# Patient Record
Sex: Female | Born: 1965
Health system: Southern US, Community
[De-identification: ages and names within clinical notes are randomized; demographics above are authoritative.]

## PROBLEM LIST (undated history)

## (undated) DIAGNOSIS — F410 Panic disorder [episodic paroxysmal anxiety] without agoraphobia: Secondary | ICD-10-CM

## (undated) DIAGNOSIS — R7303 Prediabetes: Secondary | ICD-10-CM

## (undated) DIAGNOSIS — Z87442 Personal history of urinary calculi: Secondary | ICD-10-CM

## (undated) DIAGNOSIS — J302 Other seasonal allergic rhinitis: Secondary | ICD-10-CM

## (undated) DIAGNOSIS — F419 Anxiety disorder, unspecified: Secondary | ICD-10-CM

## (undated) DIAGNOSIS — Z8632 Personal history of gestational diabetes: Secondary | ICD-10-CM

## (undated) DIAGNOSIS — R002 Palpitations: Secondary | ICD-10-CM

## (undated) HISTORY — DX: Prediabetes: R73.03

## (undated) HISTORY — DX: Personal history of gestational diabetes: Z86.32

## (undated) HISTORY — PX: LITHOTRIPSY: SUR834

---

## 1990-07-28 HISTORY — PX: TUBAL LIGATION: SHX77

## 1997-12-26 ENCOUNTER — Other Ambulatory Visit: Admission: RE | Admit: 1997-12-26 | Discharge: 1997-12-26 | Payer: Self-pay | Admitting: Obstetrics and Gynecology

## 1998-06-26 ENCOUNTER — Other Ambulatory Visit: Admission: RE | Admit: 1998-06-26 | Discharge: 1998-06-26 | Payer: Self-pay | Admitting: Obstetrics and Gynecology

## 1998-07-17 ENCOUNTER — Other Ambulatory Visit: Admission: RE | Admit: 1998-07-17 | Discharge: 1998-07-17 | Payer: Self-pay | Admitting: Obstetrics and Gynecology

## 1998-09-26 ENCOUNTER — Emergency Department (HOSPITAL_COMMUNITY): Admission: EM | Admit: 1998-09-26 | Discharge: 1998-09-26 | Payer: Self-pay | Admitting: Emergency Medicine

## 1999-02-04 ENCOUNTER — Emergency Department (HOSPITAL_COMMUNITY): Admission: EM | Admit: 1999-02-04 | Discharge: 1999-02-04 | Payer: Self-pay | Admitting: Emergency Medicine

## 1999-08-26 ENCOUNTER — Other Ambulatory Visit: Admission: RE | Admit: 1999-08-26 | Discharge: 1999-08-26 | Payer: Self-pay | Admitting: Family Medicine

## 2000-07-04 ENCOUNTER — Emergency Department (HOSPITAL_COMMUNITY): Admission: EM | Admit: 2000-07-04 | Discharge: 2000-07-05 | Payer: Self-pay | Admitting: Emergency Medicine

## 2000-07-05 ENCOUNTER — Encounter: Payer: Self-pay | Admitting: Emergency Medicine

## 2000-07-05 ENCOUNTER — Encounter: Payer: Self-pay | Admitting: Urology

## 2000-07-05 ENCOUNTER — Ambulatory Visit (HOSPITAL_COMMUNITY): Admission: EM | Admit: 2000-07-05 | Discharge: 2000-07-05 | Payer: Self-pay | Admitting: Emergency Medicine

## 2000-07-09 ENCOUNTER — Encounter: Payer: Self-pay | Admitting: Urology

## 2000-07-09 ENCOUNTER — Ambulatory Visit (HOSPITAL_COMMUNITY): Admission: RE | Admit: 2000-07-09 | Discharge: 2000-07-09 | Payer: Self-pay | Admitting: Urology

## 2000-08-06 ENCOUNTER — Ambulatory Visit (HOSPITAL_COMMUNITY): Admission: RE | Admit: 2000-08-06 | Discharge: 2000-08-06 | Payer: Self-pay | Admitting: Urology

## 2000-08-06 ENCOUNTER — Encounter: Payer: Self-pay | Admitting: Urology

## 2002-11-15 ENCOUNTER — Encounter: Admission: RE | Admit: 2002-11-15 | Discharge: 2002-11-15 | Payer: Self-pay | Admitting: Family Medicine

## 2002-11-15 ENCOUNTER — Encounter: Payer: Self-pay | Admitting: Family Medicine

## 2003-09-08 ENCOUNTER — Other Ambulatory Visit: Admission: RE | Admit: 2003-09-08 | Discharge: 2003-09-08 | Payer: Self-pay | Admitting: Family Medicine

## 2003-11-23 ENCOUNTER — Other Ambulatory Visit: Admission: RE | Admit: 2003-11-23 | Discharge: 2003-11-23 | Payer: Self-pay | Admitting: Obstetrics and Gynecology

## 2004-06-19 ENCOUNTER — Other Ambulatory Visit: Admission: RE | Admit: 2004-06-19 | Discharge: 2004-06-19 | Payer: Self-pay | Admitting: Obstetrics and Gynecology

## 2004-11-25 HISTORY — PX: OTHER SURGICAL HISTORY: SHX169

## 2005-01-08 ENCOUNTER — Ambulatory Visit (HOSPITAL_COMMUNITY): Admission: RE | Admit: 2005-01-08 | Discharge: 2005-01-08 | Payer: Self-pay | Admitting: Obstetrics and Gynecology

## 2005-05-01 ENCOUNTER — Inpatient Hospital Stay (HOSPITAL_COMMUNITY): Admission: AD | Admit: 2005-05-01 | Discharge: 2005-05-02 | Payer: Self-pay | Admitting: Obstetrics and Gynecology

## 2005-08-02 ENCOUNTER — Inpatient Hospital Stay (HOSPITAL_COMMUNITY): Admission: AD | Admit: 2005-08-02 | Discharge: 2005-08-02 | Payer: Self-pay | Admitting: Obstetrics and Gynecology

## 2005-08-21 ENCOUNTER — Inpatient Hospital Stay (HOSPITAL_COMMUNITY): Admission: AD | Admit: 2005-08-21 | Discharge: 2005-08-24 | Payer: Self-pay | Admitting: Obstetrics and Gynecology

## 2007-05-01 ENCOUNTER — Inpatient Hospital Stay (HOSPITAL_COMMUNITY): Admission: AD | Admit: 2007-05-01 | Discharge: 2007-05-01 | Payer: Self-pay | Admitting: Obstetrics and Gynecology

## 2008-09-11 ENCOUNTER — Other Ambulatory Visit: Admission: RE | Admit: 2008-09-11 | Discharge: 2008-09-11 | Payer: Self-pay | Admitting: Obstetrics and Gynecology

## 2008-09-14 ENCOUNTER — Encounter (INDEPENDENT_AMBULATORY_CARE_PROVIDER_SITE_OTHER): Payer: Self-pay | Admitting: Obstetrics and Gynecology

## 2008-09-14 ENCOUNTER — Ambulatory Visit (HOSPITAL_COMMUNITY): Admission: RE | Admit: 2008-09-14 | Discharge: 2008-09-14 | Payer: Self-pay | Admitting: Obstetrics and Gynecology

## 2009-06-13 ENCOUNTER — Other Ambulatory Visit: Admission: RE | Admit: 2009-06-13 | Discharge: 2009-06-13 | Payer: Self-pay | Admitting: Obstetrics and Gynecology

## 2009-06-25 LAB — CONVERTED CEMR LAB: Pap Smear: ABNORMAL

## 2009-07-06 ENCOUNTER — Ambulatory Visit: Payer: Self-pay | Admitting: Family

## 2009-07-06 ENCOUNTER — Encounter (INDEPENDENT_AMBULATORY_CARE_PROVIDER_SITE_OTHER): Payer: Self-pay | Admitting: *Deleted

## 2009-07-06 DIAGNOSIS — S61209A Unspecified open wound of unspecified finger without damage to nail, initial encounter: Secondary | ICD-10-CM | POA: Insufficient documentation

## 2009-08-25 ENCOUNTER — Inpatient Hospital Stay (HOSPITAL_COMMUNITY): Admission: AD | Admit: 2009-08-25 | Discharge: 2009-08-25 | Payer: Self-pay | Admitting: Obstetrics and Gynecology

## 2009-10-15 ENCOUNTER — Inpatient Hospital Stay (HOSPITAL_COMMUNITY): Admission: AD | Admit: 2009-10-15 | Discharge: 2009-10-15 | Payer: Self-pay | Admitting: Obstetrics and Gynecology

## 2009-10-15 ENCOUNTER — Ambulatory Visit: Payer: Self-pay | Admitting: Physician Assistant

## 2010-01-01 ENCOUNTER — Inpatient Hospital Stay (HOSPITAL_COMMUNITY): Admission: RE | Admit: 2010-01-01 | Discharge: 2010-01-03 | Payer: Self-pay | Admitting: Obstetrics and Gynecology

## 2010-01-03 ENCOUNTER — Encounter: Admission: RE | Admit: 2010-01-03 | Discharge: 2010-01-24 | Payer: Self-pay | Admitting: Obstetrics and Gynecology

## 2010-01-08 ENCOUNTER — Ambulatory Visit: Payer: Self-pay | Admitting: Nurse Practitioner

## 2010-01-08 ENCOUNTER — Inpatient Hospital Stay (HOSPITAL_COMMUNITY): Admission: AD | Admit: 2010-01-08 | Discharge: 2010-01-08 | Payer: Self-pay | Admitting: Obstetrics and Gynecology

## 2010-01-13 ENCOUNTER — Inpatient Hospital Stay (HOSPITAL_COMMUNITY): Admission: AD | Admit: 2010-01-13 | Discharge: 2010-01-13 | Payer: Self-pay | Admitting: Obstetrics and Gynecology

## 2010-01-13 ENCOUNTER — Ambulatory Visit: Payer: Self-pay | Admitting: Advanced Practice Midwife

## 2010-08-17 ENCOUNTER — Encounter: Payer: Self-pay | Admitting: Obstetrics and Gynecology

## 2010-08-18 ENCOUNTER — Encounter: Payer: Self-pay | Admitting: Obstetrics and Gynecology

## 2010-10-13 LAB — WET PREP, GENITAL
Clue Cells Wet Prep HPF POC: NONE SEEN
Trich, Wet Prep: NONE SEEN
Yeast Wet Prep HPF POC: NONE SEEN

## 2010-10-13 LAB — URINALYSIS, ROUTINE W REFLEX MICROSCOPIC
Ketones, ur: NEGATIVE mg/dL
Protein, ur: NEGATIVE mg/dL
Urobilinogen, UA: 0.2 mg/dL (ref 0.0–1.0)
pH: 5 (ref 5.0–8.0)

## 2010-10-13 LAB — COMPREHENSIVE METABOLIC PANEL
AST: 22 U/L (ref 0–37)
CO2: 24 mEq/L (ref 19–32)
GFR calc Af Amer: >60 mL/min (ref >60)
Glucose, Bld: 122 mg/dL — ABNORMAL HIGH (ref 70–99)
Potassium: 3.2 mEq/L — ABNORMAL LOW (ref 3.5–5.1)

## 2010-10-13 LAB — DIFFERENTIAL
Basophils Absolute: 0 10*3/uL (ref 0.0–0.1)
Eosinophils Relative: 0 % (ref 0–5)
Monocytes Absolute: 0.2 10*3/uL (ref 0.1–1.0)
Monocytes Relative: 2 % — ABNORMAL LOW (ref 3–12)
Neutro Abs: 8.7 10*3/uL — ABNORMAL HIGH (ref 1.7–7.7)

## 2010-10-13 LAB — CBC
MCV: 88.8 fL (ref 78.0–100.0)
WBC: 9.2 10*3/uL (ref 4.0–10.5)

## 2010-10-13 LAB — URINE MICROSCOPIC-ADD ON

## 2010-10-14 LAB — URINALYSIS, ROUTINE W REFLEX MICROSCOPIC
Nitrite: NEGATIVE
Protein, ur: NEGATIVE mg/dL
Specific Gravity, Urine: 1.01 (ref 1.005–1.030)

## 2010-10-14 LAB — CBC
HCT: 32.3 % — ABNORMAL LOW (ref 36.0–46.0)
Platelets: 130 10*3/uL — ABNORMAL LOW (ref 150–400)
Platelets: 165 10*3/uL (ref 150–400)
RBC: 3.7 MIL/uL — ABNORMAL LOW (ref 3.87–5.11)
RBC: 4.11 MIL/uL (ref 3.87–5.11)
WBC: 7.7 10*3/uL (ref 4.0–10.5)

## 2010-10-14 LAB — COMPREHENSIVE METABOLIC PANEL
AST: 27 U/L (ref 0–37)
Albumin: 2.9 g/dL — ABNORMAL LOW (ref 3.5–5.2)
BUN: 19 mg/dL (ref 6–23)
Calcium: 8.8 mg/dL (ref 8.4–10.5)
Creatinine, Ser: 0.59 mg/dL (ref 0.4–1.2)
GFR calc Af Amer: >60 mL/min (ref >60)
Glucose, Bld: 86 mg/dL (ref 70–99)
Potassium: 3.5 mEq/L (ref 3.5–5.1)
Total Protein: 5.9 g/dL — ABNORMAL LOW (ref 6.0–8.3)

## 2010-10-14 LAB — URINE MICROSCOPIC-ADD ON

## 2010-10-14 LAB — GLUCOSE, CAPILLARY
Glucose-Capillary: 88 mg/dL (ref 70–99)
Glucose-Capillary: 91 mg/dL (ref 70–99)
Glucose-Capillary: 92 mg/dL (ref 70–99)
Glucose-Capillary: 95 mg/dL (ref 70–99)

## 2010-10-14 LAB — URIC ACID: Uric Acid, Serum: 4.1 mg/dL (ref 2.4–7.0)

## 2010-10-14 LAB — LACTATE DEHYDROGENASE: LDH: 287 U/L — ABNORMAL HIGH (ref 94–250)

## 2010-10-14 LAB — RPR: RPR Ser Ql: NONREACTIVE

## 2010-10-20 LAB — URINALYSIS, ROUTINE W REFLEX MICROSCOPIC
Bilirubin Urine: NEGATIVE
Glucose, UA: NEGATIVE mg/dL
Hgb urine dipstick: NEGATIVE
Ketones, ur: NEGATIVE mg/dL
Protein, ur: NEGATIVE mg/dL
Specific Gravity, Urine: 1.02 (ref 1.005–1.030)
pH: 5.5 (ref 5.0–8.0)

## 2010-11-12 LAB — ABO/RH: ABO/RH(D): A POS

## 2010-11-12 LAB — CBC
MCHC: 33 g/dL (ref 30.0–36.0)
MCV: 87.4 fL (ref 78.0–100.0)
Platelets: 193 10*3/uL (ref 150–400)
RBC: 4.6 MIL/uL (ref 3.87–5.11)
RDW: 14 % (ref 11.5–15.5)

## 2010-12-10 NOTE — H&P (Signed)
[name], [name]              ACCOUNT NO.:  [id number]   MEDICAL RECORD NO.:  [id number]          PATIENT TYPE:  AMB   LOCATION:  SDC                           [facility]ILITY:  [facility]   PHYSICIAN:  Charles A. Delcambre, MDDATE OF BIRTH:  1966/06/13   DATE OF ADMISSION:  DATE OF DISCHARGE:                              HISTORY & PHYSICAL   A 45 year old gravida 7, para 4-0-2-4 noted today after no fetal heart  tones were noted at 10 weeks and 4 days, to be consistent with a fetal  demise at 8 weeks and 6 days.  This would be after having 2 prior  ultrasounds during this pregnancy.  They showed a heartbeat.  She was  given options of expectant management versus Cytotec induction versus D  and C.  She would like to get chromosome analysis of the specimen.  Products of conception done as well.   PAST MEDICAL HISTORY:  Recurrent abortions and seasonal allergies.   SURGICAL HISTORY:  D and C x2, STD x4, wide local excision of the vulva  questionable in past, renal stones.   MEDICATIONS:  Prenatal vitamins.   ALLERGIES:  No known drug allergies.   SOCIAL HISTORY:  No tobacco, ethanol, or drug use.  The patient has  monogamous relationship with her husband.   FAMILY HISTORY:  Denies family history of breast, uterus, ovaries,  cervix, colon cancer, coronary artery disease, stroke, diabetes,  hypertension, and lymphoma.   REVIEW OF SYSTEMS:  No fever, chills, rashes, lesions, headaches,  dizziness, or seasonal allergies.  No chest pain.  No shortness of  breath or wheezing.  No diarrhea, constipation, bleeding, urgency,  frequency, dysuria, hematuria, galactorrhea, or emotional changes.   PHYSICAL EXAMINATION:  LUNGS:  Clear bilaterally.  HEART:  Regular rate and rhythm.  ABDOMEN:  Soft, flat, and nontender.  PELVIC:  Normal external female genitalia.  Bartholin, urethral, and  Skene are normal.  Vault without discharge or lesions.  Multiparous  cervix, uterus consistent with 1-week size  uterus.  Adnexa, nontender  without masses bilaterally.  Ovaries are palpable with a normal size  bilaterally.   ASSESSMENT:  A 8 weeks and 5-day fetal demise and 10-week 4 days  pregnancy.  Blood type is A positive.  She gets a plan of suction D and  C.  She is informed consent and accepts the risk of infection, bleeding,  bowel and  bladder damage, retained products of chronic second D and C .  All  questions are answered.  Uterine perforation risk has addressed as well.  She will remain n.p.o. past midnight and have 700 and present for  suction dilation and curettage 02:18 p.m. and 3:30 p.m.      Charles A. Sydnee Cabal, MD  Electronically Signed     CAD/MEDQ  D:  09/12/2008  T:  09/13/2008  Job:  [id number]

## 2010-12-10 NOTE — Op Note (Signed)
[name], [name]              ACCOUNT NO.:  [id number]   MEDICAL RECORD NO.:  [id number]          PATIENT TYPE:  AMB   LOCATION:  SDC                           [facility]  [facility]   PHYSICIAN:  Charles A. Delcambre, MDDATE OF BIRTH:  10/03/1965   DATE OF PROCEDURE:  09/14/2008  DATE OF DISCHARGE:                               OPERATIVE REPORT   PREOPERATIVE DIAGNOSIS:  10-week pregnancy with 8-week 4-day fetal  demise.   POSTOPERATIVE DIAGNOSIS:  10-week pregnancy with 8-week 4-day fetal  demise.   PROCEDURE:  1. Dilation and evacuation.  2. Paracervical block.   SURGEON:  Charles A. Delcambre, MD   ASSISTANT:  None.   COMPLICATIONS:  None.   ESTIMATED BLOOD LOSS:  100 mL with D&E plus 200 mL in the vault clots.   SPECIMEN:  Large amount of products of conception to  pathology/cytogenetics for karyotype and per patient's request.   Instrument, sponge, and needle count correct x2.   DESCRIPTION OF PROCEDURE:  The patient was taken to the operating room,  placed in supine position.  General anesthetic and monitored anesthesia  care was undertaken.  She was in dorsal lithotomy position.  Sterile  prep and drape was undertaken.  Clots were removed from the vagina.  No  products of conception were noted.  Cervix was opened over 1 cm with no  active bleeding noted for the moment.  A 9-mm suction curette at 50 cmHg  was placed to the fundus, rotated, and withdrawn at the same time  yielding large amount of tissue.  I used a 9-mm suction curette.  After  four passes, minimal tissue was obtained.  Banjo curette was then used  to circumferentially curette the internal surface of the uterus.  This  did drag out some more tissue.  Suction curette was placed and yielded  no further tissue x2.  Procedure was then terminated, tack was removed.  Paracervical block had been placed at 4 and 8 o'clock, 0.25% plain  Marcaine, 10 mL at each spot.  There was no evidence of fascial location  of injection.  The patient was awakened, taken to recovery with  physician in attendance having tolerated the procedure well.     Charles A. Sydnee Cabal, MD  Electronically Signed    CAD/MEDQ  D:  09/14/2008  T:  09/15/2008  Job:  [id number]

## 2010-12-13 NOTE — H&P (Signed)
[name], HENDEL              ACCOUNT NO.:  [id number]   MEDICAL RECORD NO.:  [id number]          PATIENT TYPE:  INP   LOCATION:  9164                          [facility]ILITY[facility]  [facility]   PHYSICIAN:  Charles A. Delcambre, MDDATE OF BIRTH:  July 14, 1966   DATE OF ADMISSION:  08/21/2005  DATE OF DISCHARGE:                                HISTORY & PHYSICAL   HISTORY OF PRESENT ILLNESS:  This is a 45 year old para 3, 0, 0, 3 at 40  weeks and 1 day, [facility] Aug 20, 2005. Cervix today in the office was 1.5 cm,  50%, -3 station with baby at a very high station. Questionable vertex as I  could not feel the fontanel. Fundal height 38 cm and with my gut feeling, I  did ultrasound her in the office. Judgment was that of amniotic fluid was  low in the ballpark of 5 cm or less. Approximately 4 cm based on my gross  judgment. I did not have time to measure AFI as I was behind in the office,  so I sent her for an official ultrasound at the hospital where AFI did  return 4.1 cm. She is now admitted for induction. Covering physician, Dr.  Mia Creek will monitor Britnay over night and my partner from [facility],  Dr. Richardson Dopp will proceed with induction in the morning if she remains stable  throughout the night. These physicians will cover me in my absence.   Pregnancy has been complicated by advanced maternal age, second trimester  bleeding, abnormal quad screen of 1:19 of Downs risk but this was  inadvertently done as a quad screen when it was meant to be just an AFP at  16 weeks, as she did have a first trimester screen that was normal.  Estimated gestational age is based on 8-week ultrasound giving her an [facility] of  August 20, 2005, LMP of November 07, 2004 had been irregular from her regular  periods which would give an [facility] of Aug 14, 2005 but with the 8-week  ultrasound difference by 1 week, and the periods being irregular, we did  judge to go on with the ultrasound due date of August 20, 2005. At most,  judgment  would be that she would be 41 weeks if regular period due date was  correct.   PREGNANCY LABS:  A positive, antibody screen negative, VDRL nonreactive,  rubella immune, hepatitis B surface antigen negative, HIV nonreactive. TSH  normal. Pap low-grade in November of 2005. Gonorrhea and Chlamydia negative.  Cystic fibrosis negative. Hemoglobin at 28 weeks 11.6. She did have a 1 hour  Glucola of 216 but 3 hour glucose tolerance test was normal. Group B strep  was positive at 36 weeks.   PAST MEDICAL HISTORY:  1.  Kidney stones removed by surgery.  2.  Vulvar dysplasia.  3.  History of tubal ligation with tubal reversal.  4.  Preterm trimester bleeding.   MEDICATIONS:  Prenatal vitamins with iron.   ALLERGIES:  No known drug allergies.   PAST SURGICAL HISTORY:  Tubal ligation, tubal reversal, removal of kidney  stones.   SOCIAL HISTORY:  No tobacco, ethanol or drug use. The patient is married and  lives with her husband.   FAMILY HISTORY:  She is adopted.   PHYSICAL EXAMINATION:  GENERAL:  Alert and oriented x3, in no distress.  VITAL SIGNS:  Weight 178 pounds. Height 5 feet, 8 inches. Normal blood  pressure per R. N. and at office today. Respirations 16, pulse 88.  HEENT EXAM:  Grossly within normal limits.  NECK:  Supple without thyromegaly or adenopathy.  BREASTS:  No mass, tenderness or discharge, skin or nipple changes  bilaterally.  HEART:  Regular rate and rhythm, 2/6 systolic ejection murmur at left  sternal border.  ABDOMEN:  Gravid, fundal height 38 cm.  PELVIC:  Cervix 1.5 cm, 50% effaced, -3 station as noted above under HPI.  Intact vertex on ultrasound. Estimated fetal weight consistent with  ultrasound, approximately 3-4 weeks ago at the [facility] to [facility] as I  recall. Approximately 3600 to 3800 kg.  On examination today in the office there was no evidence of ruptured  membranes, with a dry appearing vagina although I did not carry out a  sterile speculum  exam, having not suspected oligohydramnios and at that  point when I did check her cervix under routine examination in term.  EXTREMITIES:  Minimal edema bilaterally, nontender, symmetrical.   ASSESSMENT:  Intrauterine pregnancy at 40 weeks and 1 day estimated  gestational age based on 8-week ultrasound. Oligohydramnios at 4.1 AFI.  Positive for group B strep. Advanced maternal age. Second trimester  bleeding. High 1 hour Glucola at 216 with normal 3 hour glucose challenge  test.   PLAN:  Monitor overnight. If the fetal heart rate remains reassuring without  significant decelerations, will proceed with Cervidil placement in the  morning per Dr. Dawayne Patricia request and plan. Will artificial rupture of  membranes once the cervix is ripened and proceed with Pitocin induction.  Anticipate vaginal delivery. The patient is aware of plan and wishes to  proceed as well. We will use Penicillin prophylaxis starting with placement  of the Cervidil. Will proceed as outlined. Will turn care over at this time  to Dr. Mia Creek.      Charles A. Sydnee Cabal, MD  Electronically Signed     CAD/MEDQ  D:  08/21/2005  T:  08/21/2005  Job:  [id number]   cc:   Ginger Carne, MD  Fax: [phone]

## 2010-12-13 NOTE — H&P (Signed)
[name], [name]              ACCOUNT NO.:  [id number]   MEDICAL RECORD NO.:  [id number]          PATIENT TYPE:  OBV   LOCATION:  9196                          [facility]  [facility]   PHYSICIAN:  Charles A. Delcambre, MDDATE OF BIRTH:  1966/05/23   DATE OF ADMISSION:  05/01/2005  DATE OF DISCHARGE:                                HISTORY & PHYSICAL   HISTORY OF PRESENT ILLNESS:  This is a 45 year old gravida 4, para 3-0-0-3,  at 24 weeks 2 days estimated gestational age, who is now to be admitted for  observation overnight at least to follow for vaginal bleeding in the early  third trimester.  Developed overnight and she had bleeding like a period  during an hour and thereafter had some dark brown bleeding.  Several days  ago she had some bleeding after intercourse that was mixed spotting.  Otherwise, she notes active fetal movement.  She is Rh positive.  Other  prenatal labs are within normal limits except for Pap which is low grade.  She had an AFP which returned down syndrome 1 in 19, declined amniocentesis.  This had been done after a normal first trimester screen was done.  Instead  of what was ordered, just the AFP alone.  18-week level II ultrasound  revealed normal findings.   PAST MEDICAL HISTORY:  Kidney stones, vulvar dysplasia, and history of tubal  ligation with tubal reversal, and kidney stones removed on surgery.   MEDICATIONS:  Prenatal vitamins.   ALLERGIES:  No known drug allergies.   SOCIAL HISTORY:  No tobacco, ethanol, or drug use.  The patient is married  and lives with her husband.   FAMILY HISTORY:  None.  She is adopted.   PHYSICAL EXAMINATION:  GENERAL:  Alert and oriented x3 in no acute distress.  VITAL SIGNS:  Blood pressure 120/70, weight 168 pounds, respirations 18,  pulse 90.  HEENT:  Grossly within normal limits.  NECK:  Supple without thyromegaly or adenopathy.  LUNGS:  Clear bilaterally.  HEART:  Regular rate and rhythm with 2/6 systolic  ejection murmur left  sternal border.  BREASTS:  Symmetrical, otherwise deferred.  ABDOMEN:  Gravid, fundal height 24.  PELVIC:  Some dark blood in the vault with previous ultrasound showing no  previa.  The cervix was firm, closed, posterior, nontender.  She was on the  monitor.  EXTREMITIES:  Minimal edema and nontender.  Fetal heart rate on the monitor  was 140 to 150, nonreactive, consistent with 24 weeks dating.  No  contractions were noted.   ASSESSMENT:  24-week 2-day vaginal bleeding and possible marginal separation  placenta.   PLAN:  Admit, observe overnight, Kleihauer-Betke, type and screen, CBC,  ultrasound to check cervical length and placenta and fluid.  IV fluid to  keep vein open.  I have discussed with neonatologist who agrees for  admission.  Bed  rest with bathroom privileges, continuous fetal monitoring.  Group B Strep  was done here in the office.  We will observe overnight and thereafter  consider disposition in the morning.  She was in agreement and will be  admitted to the hospital at this time.      Charles A. Sydnee Cabal, MD  Electronically Signed     CAD/MEDQ  D:  05/01/2005  T:  05/01/2005  Job:  [id number]

## 2010-12-13 NOTE — Op Note (Signed)
[facility]  Patient:    [name], [name]                  MRN: [id number] Proc. Date: 07/05/00 Adm. Date:  [id number] Attending:  Monica Becton                           Operative Report  PREOPERATIVE DIAGNOSES: 1. Large obstructing proximal left ureteral calculus. 2. Rule out pyelonephritis.  POSTOPERATIVE DIAGNOSES: 1. Large obstructing proximal left ureteral calculus. 2. Rule out pyelonephritis. 3. A large 2 cm mid left ureteral calculus.  OPERATION:  Cystoscopy, left retrograde pyeloureterogram, and insertion of a 6-French double-J stent.  SURGEON:  Claudette Laws, M.D.  INDICATIONS:  This is a 45 year old lady who presented in the [facility] Room early this morning with left flank pain and mild fever.  A subsequent CT scan revealed a 1 cm obstructing stone at the ureteropelvic junction.  She also had an incidental 2-3 cm stone in the left kidney.  Her white count was 10,200, hemoglobin 12.1.  She went home after some pain medicine only to return later on in the morning with severe colic.  They then called me for followup.  I obtained a KUB x-ray showing the above mentioned findings, and I thought we should proceed right to a cystoscopy, insertion of a double-J stent.  This was carefully explained to the patient how we would do it.  She and her husband are given the appropriate literature, and they agreed to an attempt at insertion of a double-J stent.  DESCRIPTION OF PROCEDURE:  The patient was prepped and draped in the dorsolithotomy position under LMA anesthesia.  Cystoscopy was performed. Urine was grossly clear.  The bladder was smooth.  No tumors and no calculi, and normal ureteral orifices.  Initially, I passed up a 6-French open-ended ureteral catheter over a 0.038 guidewire.  This was passed up to the stone.  Retrograde studies were performed and x-rays were taken.  The stone was impacted at the left UPJ,  and initially, very little contrast bypassed the stone.  We could not manipulate the 0.038 guidewire by the stone.  I then backed out the guidewire and injected some Xylocaine jelly mixed with some saline, and then passed up a glidewire.  After a few attempts, this did bypass the stone, and during the course of the manipulation, the stone was pushed back into the kidney.  Using the C-arm for control, the 6-French open-ended catheter was removed and then over the guidewire, we passed a 6-French, 26 cm double-J stent.  It was curled up in the upper pole calyceal system.  The distal end was curled in the bladder.  The bladder was emptied and all instruments were removed, and the patient was taken back to the recovery room in satisfactory condition.  The plan now is to follow up with [facility] on a timely basis.  The patient will be sent home today on her Cipro and Vicodin. DD:  07/05/00 TD:  07/05/00 Job: [id number]

## 2011-01-07 ENCOUNTER — Encounter: Payer: Self-pay | Admitting: Family Medicine

## 2011-01-08 ENCOUNTER — Encounter: Payer: Self-pay | Admitting: Family Medicine

## 2011-01-09 NOTE — Progress Notes (Signed)
This encounter was created in error - please disregard.

## 2011-05-08 LAB — CBC
Platelets: 215
RDW: 13.9
WBC: 7.7

## 2011-05-08 LAB — ABO/RH: ABO/RH(D): A POS

## 2011-05-08 LAB — HCG, QUANTITATIVE, PREGNANCY: hCG, Beta Chain, Quant, S: 6790 — ABNORMAL HIGH

## 2011-08-26 ENCOUNTER — Encounter: Payer: Self-pay | Admitting: Family

## 2011-08-26 ENCOUNTER — Ambulatory Visit (INDEPENDENT_AMBULATORY_CARE_PROVIDER_SITE_OTHER): Payer: BC Managed Care – PPO | Admitting: Family

## 2011-08-26 VITALS — BP 100/80 | HR 85 | Temp 98.4°F | Resp 18 | Wt 171.0 lb

## 2011-08-26 DIAGNOSIS — Z87898 Personal history of other specified conditions: Secondary | ICD-10-CM

## 2011-08-26 DIAGNOSIS — Z8742 Personal history of other diseases of the female genital tract: Secondary | ICD-10-CM

## 2011-08-26 DIAGNOSIS — N63 Unspecified lump in unspecified breast: Secondary | ICD-10-CM

## 2011-08-26 DIAGNOSIS — N6320 Unspecified lump in the left breast, unspecified quadrant: Secondary | ICD-10-CM

## 2011-08-26 DIAGNOSIS — R21 Rash and other nonspecific skin eruption: Secondary | ICD-10-CM

## 2011-08-26 MED ORDER — CLOTRIMAZOLE-BETAMETHASONE 1-0.05 % EX CREA: TOPICAL_CREAM | Freq: Two times a day (BID) | CUTANEOUS | Status: DC

## 2011-08-26 NOTE — Patient Instructions (Signed)
We will contact you about your breast imaging, and your GYN referral.  Please schedule a fasting physical at your convenience.

## 2011-08-26 NOTE — Progress Notes (Signed)
  Subjective:    Patient ID: [name], female    DOB: May 15, 1966, 46 y.o.   MRN: [id number]  HPI  Ms.  Bronkema is a 46  Yr old female who presents today with complaint of rash.  1) Rash- She is using tinactin ointment.  Rash is on the right lower leg and is pruritic.  She reports that this has been intermittent x 2 months.  2) Breast mass- noticed a few days ago.  Achey in this area. She has not had a recent mammogram.   3) Hx of abnormal pap-  She reports most recently, she had an abnormal pap during her pregnancy 2 yrs ago.   Review of Systems See HPI  Past Medical History  Diagnosis Date  . Kidney stone     History   Social History  . Marital Status: Married    Spouse Name: N/A    Number of Children: N/A  . Years of Education: N/A   Occupational History  . Not on file.   Social History Main Topics  . Smoking status: Former Games developer  . Smokeless tobacco: Never Used  . Alcohol Use: Yes  . Drug Use: No  . Sexually Active: Not on file   Other Topics Concern  . Not on file   Social History Narrative  . No narrative on file    No past surgical history on file.  No family history on file.  No Known Allergies  No current outpatient prescriptions on file prior to visit.    BP 100/80  Pulse 85  Temp(Src) 98.4 F (36.9 C) (Oral)  Resp 18  Wt 171 lb (77.565 kg)  SpO2 100%  LMP 08/01/2011       Objective:   Physical Exam  Constitutional: She appears well-developed and well-nourished.  Cardiovascular: Normal rate and regular rhythm.   No murmur heard. Pulmonary/Chest: Effort normal and breath sounds normal. No respiratory distress. She has no wheezes. She has no rales. She exhibits no tenderness.  Genitourinary:       Mildly tender firm nodule noted beneath skin overlying left lateral sternum  Psychiatric: She has a normal mood and affect. Her behavior is normal. Judgment and thought content normal.          Assessment & Plan:

## 2011-08-27 DIAGNOSIS — N6320 Unspecified lump in the left breast, unspecified quadrant: Secondary | ICD-10-CM | POA: Insufficient documentation

## 2011-08-27 DIAGNOSIS — R21 Rash and other nonspecific skin eruption: Secondary | ICD-10-CM | POA: Insufficient documentation

## 2011-08-27 DIAGNOSIS — Z87898 Personal history of other specified conditions: Secondary | ICD-10-CM | POA: Insufficient documentation

## 2011-08-27 NOTE — Assessment & Plan Note (Signed)
Trial of lotrisone 

## 2011-08-27 NOTE — Assessment & Plan Note (Signed)
Will refer to GYN for follow up.

## 2011-08-27 NOTE — Assessment & Plan Note (Signed)
Case was reviewed with Dr. Jean Rosenthal at the breast center and he recommended diagnostic mammo and ultrasound to further evaluate. I suspect that this will be benign.

## 2011-09-08 ENCOUNTER — Ambulatory Visit
Admission: RE | Admit: 2011-09-08 | Discharge: 2011-09-08 | Disposition: A | Discharge status: Home / Self Care | Payer: BC Managed Care – PPO | Source: Ambulatory Visit | Attending: Family | Admitting: Family

## 2011-09-08 DIAGNOSIS — N6320 Unspecified lump in the left breast, unspecified quadrant: Secondary | ICD-10-CM

## 2011-09-08 LAB — HM MAMMOGRAPHY: HM Mammogram: NORMAL

## 2011-09-09 ENCOUNTER — Telehealth: Payer: Self-pay | Admitting: Family

## 2011-09-09 NOTE — Telephone Encounter (Signed)
Left message for pt to return call.  Reviewed Mammogram ultrasound results.  Results show that the spot in the middle of her chest is a cyst.  There was another spot noted in the left breast which is likely benign, but the radiologist is recommending that she have an ultrasound of the left breast at 6, 12 and 24 months, and that she have diagnostic mammo in 1 year.

## 2011-09-10 NOTE — Telephone Encounter (Signed)
Notified pt of results and scheduled fasting physical for 09/17/11 at 9:00am.

## 2011-09-17 ENCOUNTER — Other Ambulatory Visit: Payer: Self-pay | Admitting: Family

## 2011-09-17 ENCOUNTER — Encounter: Payer: BC Managed Care – PPO | Admitting: Family

## 2011-09-17 ENCOUNTER — Ambulatory Visit (INDEPENDENT_AMBULATORY_CARE_PROVIDER_SITE_OTHER): Payer: BC Managed Care – PPO | Admitting: Family

## 2011-09-17 ENCOUNTER — Encounter: Payer: Self-pay | Admitting: Family

## 2011-09-17 DIAGNOSIS — Z0000 Encounter for general adult medical examination without abnormal findings: Secondary | ICD-10-CM | POA: Insufficient documentation

## 2011-09-17 DIAGNOSIS — N6320 Unspecified lump in the left breast, unspecified quadrant: Secondary | ICD-10-CM

## 2011-09-17 DIAGNOSIS — N63 Unspecified lump in unspecified breast: Secondary | ICD-10-CM

## 2011-09-17 LAB — LIPID PANEL
Cholesterol: 182 mg/dL (ref 0–200)
LDL Cholesterol: 89 mg/dL (ref 0–99)
Triglycerides: 71 mg/dL (ref <150)
VLDL: 14 mg/dL (ref 0–40)

## 2011-09-17 LAB — CBC WITH DIFFERENTIAL/PLATELET
Basophils Absolute: 0 10*3/uL (ref 0.0–0.1)
Basophils Relative: 0 % (ref 0–1)
Eosinophils Absolute: 0.1 10*3/uL (ref 0.0–0.7)
MCH: 27.5 pg (ref 26.0–34.0)
MCHC: 32.8 g/dL (ref 30.0–36.0)
Monocytes Absolute: 0.3 10*3/uL (ref 0.1–1.0)
Monocytes Relative: 6 % (ref 3–12)
Neutro Abs: 2.8 10*3/uL (ref 1.7–7.7)
Neutrophils Relative %: 53 % (ref 43–77)
RDW: 14.8 % (ref 11.5–15.5)

## 2011-09-17 LAB — HEPATIC FUNCTION PANEL
Alkaline Phosphatase: 67 U/L (ref 39–117)
Bilirubin, Direct: 0.1 mg/dL (ref 0.0–0.3)
Indirect Bilirubin: 0.5 mg/dL (ref 0.0–0.9)
Total Protein: 7.1 g/dL (ref 6.0–8.3)

## 2011-09-17 LAB — TSH: TSH: 0.874 u[IU]/mL (ref 0.350–4.500)

## 2011-09-17 MED ORDER — CETIRIZINE HCL 10 MG PO TABS: 10.0000 mg | ORAL_TABLET | Freq: Every day | ORAL | Status: DC

## 2011-09-17 MED ORDER — CLOTRIMAZOLE-BETAMETHASONE 1-0.05 % EX CREA: TOPICAL_CREAM | Freq: Two times a day (BID) | CUTANEOUS | Status: DC

## 2011-09-17 NOTE — Assessment & Plan Note (Signed)
She is aware re: need to repeat breast imaging in 6 months.

## 2011-09-17 NOTE — Assessment & Plan Note (Signed)
Pt counseled on weight loss- healthy diet/exercise. Goal BMI <25 and she is slightly above this. Immunizations reviewed and up to date.  Defer pap to GYN.

## 2011-09-17 NOTE — Progress Notes (Signed)
Subjective:    Patient ID: [name], female    DOB: 06-Aug-1965, 46 y.o.   MRN: [id number]  HPI  [name] is a 46 yr old female who presents today for CPX.  She is scheduled for Pap with [facility].  She is up to date on tetanus, and flu shot.  Recently had mammogram due to mass which was found to be a sebaceous cyst. She reports that she has started doing pilates during lunch at work. Diet is  Fair.  Reports that she learned a lot about her diet during her pregnancy when she had gestation.    Allergic rhinitis- notes that rain worsens her symptoms.  She wants rx for zytrec.    Review of Systems  Constitutional: Negative for unexpected weight change.  HENT: Negative for hearing loss.        Some nasal congestion  Eyes: Negative for visual disturbance.  Respiratory: Negative for cough.   Cardiovascular: Negative for leg swelling.  Gastrointestinal: Negative for nausea, vomiting and diarrhea.       Had episode of epigastric pain after heavy meal once in the last few months.   Genitourinary: Negative for menstrual problem.  Musculoskeletal: Negative for back pain.  Skin:       Notes improvement in the rash on the right shin.  No longer itching.   Neurological: Negative for headaches.  Hematological: Negative for adenopathy.  Psychiatric/Behavioral:       Notes occasional mild anxiety symptoms.     Past Medical History  Diagnosis Date  . Kidney stone   . Hx gestational diabetes     History   Social History  . Marital Status: Married    Spouse Name: N/A    Number of Children: N/A  . Years of Education: N/A   Occupational History  . Not on file.   Social History Main Topics  . Smoking status: Former Games developer  . Smokeless tobacco: Never Used  . Alcohol Use: Yes  . Drug Use: No  . Sexually Active: Not on file   Other Topics Concern  . Not on file   Social History Narrative   Adopted    Past Surgical History  Procedure Date  . Other surgical history  11/2004  . Lithotripsy     Family History  Problem Relation Age of Onset  . Adopted: Yes    No Known Allergies  No current outpatient prescriptions on file prior to visit.    BP 120/82  Pulse 90  Temp(Src) 98.2 F (36.8 C) (Oral)  Resp 16  Ht 5\' 8"  (1.727 m)  Wt 170 lb (77.111 kg)  BMI 25.85 kg/m2  SpO2 99%  LMP 09/01/2011       Objective:   Physical Exam  Physical Exam  Constitutional: She is oriented to person, place, and time. She appears well-developed and well-nourished. No distress.  HENT:  Head: Normocephalic and atraumatic.  Right Ear: Tympanic membrane and ear canal normal.  Left Ear: Tympanic membrane and ear canal normal.  Mouth/Throat: Oropharynx is clear and moist.  Eyes: Pupils are equal, round, and reactive to light. No scleral icterus.  Neck: Normal range of motion. No thyromegaly present.  Cardiovascular: Normal rate and regular rhythm.   No murmur heard. Pulmonary/Chest: Effort normal and breath sounds normal. No respiratory distress. He has no wheezes. She has no rales. She exhibits no tenderness.  Abdominal: Soft. Bowel sounds are normal. He exhibits no distension and no mass. There is no tenderness. There  is no rebound and no guarding.  Musculoskeletal: She exhibits no edema.  Lymphadenopathy:    She has no cervical adenopathy.  Neurological: She is alert and oriented to person, place, and time. She has normal reflexes. She exhibits normal muscle tone. Coordination normal.  Skin: Skin is warm and dry.  Psychiatric: She has a normal mood and affect. Her behavior is normal. Judgment and thought content normal.  Breasts/Pelvic- deferred to GYN     Assessment & Plan:         Assessment & Plan:

## 2011-09-17 NOTE — Patient Instructions (Signed)
Please complete your blood work prior to leaving today. Follow up in 1 year, sooner if problems/concerns.. 

## 2011-09-18 LAB — BASIC METABOLIC PANEL WITH GFR
BUN: 10 mg/dL (ref 6–23)
Calcium: 9.3 mg/dL (ref 8.4–10.5)
Creat: 0.66 mg/dL (ref 0.50–1.10)
GFR, Est Non African American: >89 mL/min

## 2011-09-18 LAB — HEMOGLOBIN A1C: Mean Plasma Glucose: 128 mg/dL — ABNORMAL HIGH (ref <117)

## 2011-09-19 ENCOUNTER — Encounter: Payer: Self-pay | Admitting: Family

## 2011-09-19 DIAGNOSIS — R7303 Prediabetes: Secondary | ICD-10-CM | POA: Insufficient documentation

## 2011-09-19 HISTORY — DX: Prediabetes: R73.03

## 2011-09-22 ENCOUNTER — Telehealth: Payer: Self-pay | Admitting: *Deleted

## 2011-09-22 DIAGNOSIS — R7309 Other abnormal glucose: Secondary | ICD-10-CM

## 2011-09-22 NOTE — Telephone Encounter (Signed)
Pt has been notified and will return to the lab around 12/22/11. Future lab order placed and copy sent to the lab. Copy mailed to pt as reminder.

## 2011-09-22 NOTE — Telephone Encounter (Signed)
Message copied by Kathi Simpers on Mon Sep 22, 2011 11:03 AM ------      Message from: O'SULLIVAN, Celise      Created: Fri Sep 19, 2011  1:17 PM       Pls call pt and let her know that her testing shows pre-diabetes.  A1C is 6.1.  Diagnosis of diabetes is 6.5.  She should work hard on diet and exercise.  This test should be repeated in 3 months.  Thyroid, blood count, CBC, liver- all normal.

## 2012-03-01 ENCOUNTER — Ambulatory Visit (INDEPENDENT_AMBULATORY_CARE_PROVIDER_SITE_OTHER): Payer: BC Managed Care – PPO | Admitting: Family

## 2012-03-01 ENCOUNTER — Encounter: Payer: Self-pay | Admitting: Family

## 2012-03-01 VITALS — BP 120/78 | HR 89 | Temp 98.6°F | Resp 16 | Wt 163.1 lb

## 2012-03-01 DIAGNOSIS — J329 Chronic sinusitis, unspecified: Secondary | ICD-10-CM | POA: Insufficient documentation

## 2012-03-01 MED ORDER — AMOXICILLIN-POT CLAVULANATE 875-125 MG PO TABS: 1.0000 | ORAL_TABLET | Freq: Two times a day (BID) | ORAL | Status: DC

## 2012-03-01 NOTE — Assessment & Plan Note (Signed)
Will rx with augmentin.  Recommended ibuprofen for pain.  Pt is instructed to call if symptoms worsen, or if no improvement in 2-3 days.  Go to ER if problems develop with vision.  Pt verbalizes understanding.

## 2012-03-01 NOTE — Patient Instructions (Addendum)

## 2012-03-01 NOTE — Progress Notes (Signed)
  Subjective:    Patient ID: [name], female    DOB: 09-19-1965, 46 y.o.   MRN: [id number]  HPI Ms.  [name] is a 46 yr old female who presents today with chief complaint of nasal congestion and L cheek pain/tenderness. Symptoms started 1 week ago- with nasal congestion/uri.  Had a sore throat which resolved.  Thursday thought she was improving.  Yesterday pain worsened.  Copious nasal drainage.  Denies fever, energy is poor.     Review of Systems See HPI Past Medical History  Diagnosis Date  . Kidney stone   . Hx gestational diabetes   . Borderline diabetes 09/19/2011    History   Social History  . Marital Status: Married    Spouse Name: N/A    Number of Children: N/A  . Years of Education: N/A   Occupational History  . Not on file.   Social History Main Topics  . Smoking status: Former Games developer  . Smokeless tobacco: Never Used  . Alcohol Use: Yes  . Drug Use: No  . Sexually Active: Not on file   Other Topics Concern  . Not on file   Social History Narrative   Adopted    Past Surgical History  Procedure Date  . Other surgical history 11/2004  . Lithotripsy     Family History  Problem Relation Age of Onset  . Adopted: Yes    No Known Allergies  Current Outpatient Prescriptions on File Prior to Visit  Medication Sig Dispense Refill  . cetirizine (ZYRTEC ALLERGY) 10 MG tablet Take 1 tablet (10 mg total) by mouth daily.  30 tablet  11  . clotrimazole-betamethasone (LOTRISONE) cream Apply topically 2 (two) times daily. Apply twice daily to the affected area.  30 g  0    BP 120/78  Pulse 89  Temp 98.6 F (37 C) (Oral)  Resp 16  Wt 163 lb 1.9 oz (73.991 kg)  SpO2 98%       Objective:   Physical Exam  Constitutional: She is oriented to person, place, and time. She appears well-developed and well-nourished. No distress.  HENT:  Right Ear: Tympanic membrane and ear canal normal.  Left Ear: Tympanic membrane and ear canal normal.  Mouth/Throat: No  posterior oropharyngeal edema or posterior oropharyngeal erythema.       No frontal sinus pain to palpation.   L maxillary pain to palpation   Cardiovascular: Normal rate and regular rhythm.   No murmur heard. Pulmonary/Chest: Effort normal and breath sounds normal. No respiratory distress. She has no wheezes. She has no rales. She exhibits no tenderness.  Neurological: She is alert and oriented to person, place, and time.  Psychiatric: She has a normal mood and affect. Her behavior is normal. Judgment and thought content normal.          Assessment & Plan:

## 2012-03-08 ENCOUNTER — Telehealth: Payer: Self-pay | Admitting: Family

## 2012-03-08 MED ORDER — CEFUROXIME AXETIL 500 MG PO TABS: 500.0000 mg | ORAL_TABLET | Freq: Two times a day (BID) | ORAL | Status: AC

## 2012-03-08 NOTE — Telephone Encounter (Signed)
Please advise 

## 2012-03-08 NOTE — Telephone Encounter (Signed)
Rx sent for ceftin

## 2012-03-08 NOTE — Telephone Encounter (Signed)
Patient states that she was seen on 03/01/12. She is feeling somewhat better but the antibiotic has been giving her headaches and an upset stomach. She stopped taking antibiotic on day 5. It was a 10 day prescription. She wants to know if Earnie would call her in something else? [facility] [address].

## 2012-03-09 ENCOUNTER — Telehealth: Payer: Self-pay | Admitting: Family

## 2012-03-09 DIAGNOSIS — R928 Other abnormal and inconclusive findings on diagnostic imaging of breast: Secondary | ICD-10-CM

## 2012-03-09 NOTE — Telephone Encounter (Signed)
Left message on home # to return my call. (see additional phone note of 03/08/12).

## 2012-03-09 NOTE — Telephone Encounter (Signed)
Pls let pt know that she is due for follow up breast ultrasound.  I will place order.

## 2012-03-09 NOTE — Telephone Encounter (Signed)
Left message on home # to return my call. 

## 2012-03-10 NOTE — Telephone Encounter (Signed)
Left message on home number to return my call. 

## 2012-03-10 NOTE — Telephone Encounter (Signed)
Patient returned phone call. Best # [phone]

## 2012-03-10 NOTE — Telephone Encounter (Signed)
Attempted to reach pt and received voicemail. Left message for pt to return my call.

## 2012-03-11 NOTE — Telephone Encounter (Signed)
Pt returned our call and requested that we leave her a detailed message on her cell# if she is not able to answer when we call her back. Attempted to reach pt and left detailed message that we will arrange follow up breast ultrasound and to call if she hasn't received appt date/time within 1 week.

## 2012-03-11 NOTE — Telephone Encounter (Signed)
Pt returned our call and left message to call her back on her cell and can leave detailed message if she is not able to answer. Attempted to reach pt and left detailed message on cell# re: rx completion and to call if symptoms worsen.

## 2012-03-18 ENCOUNTER — Other Ambulatory Visit: Payer: Self-pay | Admitting: Family

## 2012-03-18 ENCOUNTER — Ambulatory Visit
Admission: RE | Admit: 2012-03-18 | Discharge: 2012-03-18 | Disposition: A | Discharge status: Home / Self Care | Payer: BC Managed Care – PPO | Source: Ambulatory Visit | Attending: Family | Admitting: Family

## 2012-03-18 DIAGNOSIS — R928 Other abnormal and inconclusive findings on diagnostic imaging of breast: Secondary | ICD-10-CM

## 2012-09-25 ENCOUNTER — Other Ambulatory Visit: Payer: Self-pay | Admitting: Family

## 2012-11-07 ENCOUNTER — Emergency Department (HOSPITAL_COMMUNITY): Payer: BC Managed Care – PPO

## 2012-11-07 ENCOUNTER — Encounter (HOSPITAL_COMMUNITY): Payer: Self-pay

## 2012-11-07 ENCOUNTER — Emergency Department (HOSPITAL_COMMUNITY)
Admission: EM | Admit: 2012-11-07 | Discharge: 2012-11-07 | Disposition: A | Discharge status: Home / Self Care | Payer: BC Managed Care – PPO | Attending: Emergency Medicine | Admitting: Emergency Medicine

## 2012-11-07 DIAGNOSIS — Z87891 Personal history of nicotine dependence: Secondary | ICD-10-CM | POA: Insufficient documentation

## 2012-11-07 DIAGNOSIS — Z8632 Personal history of gestational diabetes: Secondary | ICD-10-CM | POA: Insufficient documentation

## 2012-11-07 DIAGNOSIS — I10 Essential (primary) hypertension: Secondary | ICD-10-CM | POA: Insufficient documentation

## 2012-11-07 DIAGNOSIS — Z9889 Other specified postprocedural states: Secondary | ICD-10-CM | POA: Insufficient documentation

## 2012-11-07 DIAGNOSIS — Z87442 Personal history of urinary calculi: Secondary | ICD-10-CM | POA: Insufficient documentation

## 2012-11-07 DIAGNOSIS — R110 Nausea: Secondary | ICD-10-CM | POA: Insufficient documentation

## 2012-11-07 DIAGNOSIS — Z3202 Encounter for pregnancy test, result negative: Secondary | ICD-10-CM | POA: Insufficient documentation

## 2012-11-07 DIAGNOSIS — K8020 Calculus of gallbladder without cholecystitis without obstruction: Secondary | ICD-10-CM | POA: Insufficient documentation

## 2012-11-07 LAB — CBC WITH DIFFERENTIAL/PLATELET
Basophils Relative: 0 % (ref 0–1)
Eosinophils Absolute: 0.2 10*3/uL (ref 0.0–0.7)
HCT: 36.8 % (ref 36.0–46.0)
Hemoglobin: 12.2 g/dL (ref 12.0–15.0)
Lymphs Abs: 1.7 10*3/uL (ref 0.7–4.0)
MCH: 27.4 pg (ref 26.0–34.0)
MCHC: 33.2 g/dL (ref 30.0–36.0)
Monocytes Absolute: 0.4 10*3/uL (ref 0.1–1.0)
Monocytes Relative: 5 % (ref 3–12)

## 2012-11-07 LAB — COMPREHENSIVE METABOLIC PANEL
Albumin: 3.7 g/dL (ref 3.5–5.2)
BUN: 14 mg/dL (ref 6–23)
Chloride: 103 mEq/L (ref 96–112)
Creatinine, Ser: 0.69 mg/dL (ref 0.50–1.10)
GFR calc Af Amer: >90 mL/min (ref >90)
Glucose, Bld: 160 mg/dL — ABNORMAL HIGH (ref 70–99)
Total Bilirubin: 0.3 mg/dL (ref 0.3–1.2)
Total Protein: 7.1 g/dL (ref 6.0–8.3)

## 2012-11-07 LAB — AMYLASE: Amylase: 62 U/L (ref 0–105)

## 2012-11-07 LAB — URINALYSIS, ROUTINE W REFLEX MICROSCOPIC
Bilirubin Urine: NEGATIVE
Nitrite: NEGATIVE
Protein, ur: NEGATIVE mg/dL
Specific Gravity, Urine: 1.025 (ref 1.005–1.030)
Urobilinogen, UA: 0.2 mg/dL (ref 0.0–1.0)

## 2012-11-07 LAB — URINE MICROSCOPIC-ADD ON

## 2012-11-07 LAB — GLUCOSE, CAPILLARY: Glucose-Capillary: 172 mg/dL — ABNORMAL HIGH (ref 70–99)

## 2012-11-07 LAB — LIPASE, BLOOD: Lipase: 57 U/L (ref 11–59)

## 2012-11-07 MED ORDER — POTASSIUM CHLORIDE 20 MEQ/15ML (10%) PO LIQD
40.0000 meq | Freq: Once | ORAL | Status: AC
  Administered 2012-11-07: 40 meq via ORAL
  Filled 2012-11-07: qty 30

## 2012-11-07 MED ORDER — MORPHINE SULFATE 4 MG/ML IJ SOLN
4.0000 mg | INTRAMUSCULAR | Status: DC | PRN
  Administered 2012-11-07: 4 mg via INTRAVENOUS
  Filled 2012-11-07: qty 1

## 2012-11-07 MED ORDER — SODIUM CHLORIDE 0.9 % IV SOLN
1000.0000 mL | INTRAVENOUS | Status: DC
  Administered 2012-11-07: 1000 mL via INTRAVENOUS

## 2012-11-07 MED ORDER — HYDROCODONE-ACETAMINOPHEN 5-325 MG PO TABS: 1.0000 | ORAL_TABLET | Freq: Four times a day (QID) | ORAL | Status: DC | PRN

## 2012-11-07 MED ORDER — ONDANSETRON HCL 4 MG/2ML IJ SOLN
4.0000 mg | Freq: Four times a day (QID) | INTRAMUSCULAR | Status: DC | PRN
  Administered 2012-11-07: 4 mg via INTRAVENOUS
  Filled 2012-11-07: qty 2

## 2012-11-07 MED ORDER — ONDANSETRON 8 MG PO TBDP: 8.0000 mg | ORAL_TABLET | Freq: Three times a day (TID) | ORAL | Status: DC | PRN

## 2012-11-07 NOTE — ED Notes (Signed)
Upper stomach pain after eating.  PTAR on scene glucose 64 oral glucose given HTN 190/90.  Pt present swith NAD- VSS.

## 2012-11-07 NOTE — ED Provider Notes (Addendum)
History     CSN: [id number]  Arrival date & time 11/07/12  1711   First MD Initiated Contact with Patient 11/07/12 1728      Chief Complaint  Patient presents with  . Hypoglycemia  . Hypertension  . Abdominal Pain    (Consider location/radiation/quality/duration/timing/severity/associated sxs/prior treatment) HPI Comments: Pt comes in with cc of abd pain. Pt has no significant medical hx. States that she has been having off and on abd pain x 7 years, but today, about an hour prior to arrival, it was significantly worse. The pain was in the epigastrium, and radiates to bilateral sides, but not the back. There was associated nausea, no emesis. No hx of PUD, no bloody BM. Pt has no chest pain, no cardiac hx, no substance abuse hx. She does indicate that the pain on occasion is due to food intake.  Upon EMS arrival, patient was aox3, but with CBG in 60s. She was given some food. She is not on any exogenous insulin or diabetes meds.   Patient is a 47 y.o. female presenting with hypertension and abdominal pain. The history is provided by the patient.  Hypertension Associated symptoms include abdominal pain. Pertinent negatives include no chest pain and no shortness of breath.  Abdominal Pain Associated symptoms: nausea   Associated symptoms: no chest pain, no constipation, no cough, no diarrhea, no hematuria, no shortness of breath and no vomiting     Past Medical History  Diagnosis Date  . Kidney stone   . Hx gestational diabetes   . Borderline diabetes 09/19/2011    Past Surgical History  Procedure Laterality Date  . Other surgical history  11/2004  . Lithotripsy      Family History  Problem Relation Age of Onset  . Adopted: Yes    History  Substance Use Topics  . Smoking status: Former Games developer  . Smokeless tobacco: Never Used  . Alcohol Use: Yes    OB History   Grav Para Term Preterm Abortions TAB SAB Ect Mult Living                  Review of Systems   Constitutional: Negative for activity change.  HENT: Negative for facial swelling and neck pain.   Respiratory: Negative for cough, shortness of breath and wheezing.   Cardiovascular: Negative for chest pain.  Gastrointestinal: Positive for nausea and abdominal pain. Negative for vomiting, diarrhea, constipation, blood in stool and abdominal distention.  Genitourinary: Negative for hematuria and difficulty urinating.  Skin: Negative for color change.  Neurological: Negative for speech difficulty.  Hematological: Does not bruise/bleed easily.  Psychiatric/Behavioral: Negative for confusion.    Allergies  Review of patient's allergies indicates no known allergies.  Home Medications   Current Outpatient Rx  Name  Route  Sig  Dispense  Refill  . cetirizine (ZYRTEC) 10 MG tablet      TAKE 1 TABLET (10 MG TOTAL) BY MOUTH DAILY.   30 tablet   11     BP 140/64  Pulse 98  Resp 18  SpO2 98%  LMP 11/07/2012  Physical Exam  Nursing note and vitals reviewed. Constitutional: She is oriented to person, place, and time. She appears well-developed and well-nourished.  HENT:  Head: Normocephalic and atraumatic.  Eyes: EOM are normal. Pupils are equal, round, and reactive to light.  Neck: Neck supple.  Cardiovascular: Normal rate, regular rhythm and normal heart sounds.   No murmur heard. Pulmonary/Chest: Effort normal. No respiratory distress.  Abdominal: Soft. She  exhibits no distension. There is tenderness. There is no rebound and no guarding.  Epigastric tenderness, neg murphy's signs  Neurological: She is alert and oriented to person, place, and time.  Skin: Skin is warm and dry.    ED Course  Procedures (including critical care time)  Labs Reviewed  GLUCOSE, CAPILLARY - Abnormal; Notable for the following:    Glucose-Capillary 172 (*)    All other components within normal limits  CBC WITH DIFFERENTIAL  COMPREHENSIVE METABOLIC PANEL  AMYLASE  LIPASE, BLOOD  URINALYSIS,  ROUTINE W REFLEX MICROSCOPIC   No results found.   No diagnosis found.    MDM  Pt comes in with cc of sudden onset epigastric abd pain - that has improved on it's own overtime. Pt has had similar pain x 7 years - just not as severe as this one. DDX: PUD GERD Hepatobiliary process - including cholelithiasis Pancreatitis Pneumonia  Pt has no ACS risk factors, bilateral pulses are equal. Will get GI labs, and will also get [place] abd.  Derwood Kaplan, MD 11/07/12 1809  7:46 PM US shows gall stones. Pt has slight alk phos elevation. She is feeling a lot better now, so need for Surgery consultation. Will give surgery f/u and discharge  Derwood Kaplan, MD 11/07/12 1947

## 2012-11-07 NOTE — ED Notes (Signed)
MD at bedside. 

## 2012-11-07 NOTE — ED Notes (Signed)
WNU:UVOZ<DG> Expected date:11/07/12<BR> Expected time: 5:11 PM<BR> Means of arrival:Ambulance<BR> Comments:<BR> Hypoglycemia tachy

## 2012-11-08 ENCOUNTER — Telehealth: Payer: Self-pay | Admitting: *Deleted

## 2012-11-08 ENCOUNTER — Other Ambulatory Visit: Payer: Self-pay

## 2012-11-08 DIAGNOSIS — Z1231 Encounter for screening mammogram for malignant neoplasm of breast: Secondary | ICD-10-CM

## 2012-11-08 NOTE — Telephone Encounter (Signed)
No further instructions. Will review at her follow up visit.

## 2012-11-08 NOTE — Telephone Encounter (Signed)
Pt called to arrange ER follow up for gallstone. States that her BS was checked upon arrival to the ER and was told that it was 61. Pt states she was not symptomatic at the time. She was given glucose in the ER and reports that her BS elevated to 172. Pt wanted [place] to be aware of episode as she states that her blood sugar was borderline during pregnancy. Reports that she had not missed any meals that day. Has been eating healthier. Advised pt to continue with her regular meals, may need snack between meal. Discussed symptoms of low blood sugar and to check BS if she feels onset of symptoms and to call if she has further low blood sugar episodes. Please advise if there are further instructions.

## 2012-11-15 ENCOUNTER — Ambulatory Visit (INDEPENDENT_AMBULATORY_CARE_PROVIDER_SITE_OTHER): Payer: BC Managed Care – PPO | Admitting: General Surgery

## 2012-11-15 ENCOUNTER — Encounter (INDEPENDENT_AMBULATORY_CARE_PROVIDER_SITE_OTHER): Payer: Self-pay | Admitting: General Surgery

## 2012-11-15 VITALS — BP 146/60 | HR 80 | Temp 98.7°F | Resp 12 | Ht 67.0 in | Wt 169.0 lb

## 2012-11-15 DIAGNOSIS — K8020 Calculus of gallbladder without cholecystitis without obstruction: Secondary | ICD-10-CM

## 2012-11-15 NOTE — Progress Notes (Signed)
Patient ID: [name], female   DOB: [birth year], 47 y.o.   MRN: [id number]  Chief Complaint  Patient presents with  . New Evaluation    eval GB    HPI [name] is a 47 y.o. female.   HPI 47 yo female referred by Dr Rhunette Croft for evaluation of gallstones. The patient states that she has had intermittent symptoms on and off for the past 7 years. It started with her last pregnancy. She describes it as a intermittent ache in her epigastric area. Generally it would last an hour or 2 but eventually resolved. However on April 13, she developed severe pain in the epigastric area associated with nausea vomiting. She actually called the ambulance and was taken to [facility] long hospital. There she was evaluated and was found to have cholelithiasis with some mild LFT elevation. Since the other week she has remained symptom free. She denies any fevers or chills. She denies any jaundice. She denies any melena, hematochezia, or acholic stools. She denies any NSAID use. She is taking the pain medication once or twice since leaving the emergency room but has not had any severe episodes like she had the other day. She denies any weight loss. She has had some bloating as well.  Past Medical History  Diagnosis Date  . Hx gestational diabetes   . Borderline diabetes 09/19/2011  . Kidney stones     Past Surgical History  Procedure Laterality Date  . Other surgical history  11/2004  . Lithotripsy    . Tubal ligation  1992    reversal in 2007    Family History  Problem Relation Age of Onset  . Adopted: Yes  . Family history unknown: Yes    Social History History  Substance Use Topics  . Smoking status: Former Games developer  . Smokeless tobacco: Never Used  . Alcohol Use: Yes    No Known Allergies  Current Outpatient Prescriptions  Medication Sig Dispense Refill  . cetirizine (ZYRTEC) 10 MG tablet TAKE 1 TABLET (10 MG TOTAL) BY MOUTH DAILY.  30 tablet  11  . HYDROcodone-acetaminophen  (NORCO/VICODIN) 5-325 MG per tablet Take 1 tablet by mouth every 6 (six) hours as needed for pain.  10 tablet  0  . ondansetron (ZOFRAN ODT) 8 MG disintegrating tablet Take 1 tablet (8 mg total) by mouth every 8 (eight) hours as needed for nausea.  20 tablet  0   No current facility-administered medications for this visit.    Review of Systems Review of Systems  Constitutional: Negative for fever, chills and unexpected weight change.  HENT: Negative for hearing loss, congestion, sore throat, trouble swallowing and voice change.   Eyes: Negative for visual disturbance.  Respiratory: Negative for cough, shortness of breath and wheezing.   Cardiovascular: Negative for chest pain, palpitations and leg swelling.  Gastrointestinal: Positive for nausea, vomiting and abdominal pain. Negative for diarrhea, constipation, blood in stool, abdominal distention and anal bleeding.  Genitourinary: Negative for hematuria, vaginal bleeding and difficulty urinating.  Musculoskeletal: Negative for arthralgias.  Skin: Negative for rash and wound.  Neurological: Negative for seizures, syncope and headaches.  Hematological: Negative for adenopathy. Does not bruise/bleed easily.  Psychiatric/Behavioral: Negative for confusion.    Blood pressure 146/60, pulse 80, temperature 98.7 F (37.1 C), temperature source Temporal, resp. rate 12, height 5\' 7"  (1.702 m), weight 169 lb (76.658 kg), last menstrual period 11/07/2012.  Physical Exam Physical Exam  Vitals reviewed. Constitutional: She is oriented to person, place, and time.  She appears well-developed and well-nourished. No distress.  HENT:  Head: Normocephalic and atraumatic.  Right Ear: External ear normal.  Left Ear: External ear normal.  Eyes: Conjunctivae are normal. No scleral icterus.  Neck: Normal range of motion. Neck supple. No tracheal deviation present. No thyromegaly present.  Cardiovascular: Normal rate, regular rhythm and normal heart sounds.    Pulmonary/Chest: Effort normal and breath sounds normal. No respiratory distress. She has no wheezes.  Abdominal: Soft. She exhibits no distension. There is no tenderness.    Musculoskeletal: She exhibits no edema and no tenderness.  Lymphadenopathy:    She has no cervical adenopathy.  Neurological: She is alert and oriented to person, place, and time.  Skin: Skin is warm and dry. No rash noted. She is not diaphoretic. No erythema.  Psychiatric: She has a normal mood and affect. Her behavior is normal. Judgment and thought content normal.    Data Reviewed Ed note Labs from 4/13 - nml cbc; AST 51, ALT 42; AP 158  COMPLETE ABDOMINAL ULTRASOUND 11/07/12 Comparison: None.  Findings:  Gallbladder: A 1.4 cm mobile stone is identified the gallbladder.  There is no gallbladder wall thickening or pericholecystic fluid.  Sonographer reports negative Murphy's sign.  Common bile duct: Measures 0.6 cm.  Liver: No focal lesion identified. Within normal limits in  parenchymal echogenicity.  IVC: Appears normal.  Pancreas: No focal abnormality seen.  Spleen: Measures 6.8 cm and appears normal.  Right Kidney: Measures 10.7 cm and appears normal.  Left Kidney: Measures 11.1 cm and appears normal.  Abdominal aorta: No aneurysm identified.  IMPRESSION:  A single 1.4 cm mobile gallstone is identified without evidence of  cholecystitis.    Assessment    Symptomatic cholelithiasis     Plan    I believe the patient's symptoms are consistent with gallbladder disease.  We discussed gallbladder disease. The patient was given Agricultural engineer. We discussed non-operative and operative management. We discussed the signs & symptoms of acute cholecystitis  I discussed laparoscopic cholecystectomy with IOC in detail.  The patient was given educational material as well as diagrams detailing the procedure.  We discussed the risks and benefits of a laparoscopic cholecystectomy including, but not  limited to bleeding, infection, injury to surrounding structures such as the intestine or liver, bile leak, retained gallstones, need to convert to an open procedure, prolonged diarrhea, blood clots such as  DVT, common bile duct injury, anesthesia risks, and possible need for additional procedures.  We discussed the typical post-operative recovery course. I explained that the likelihood of improvement of their symptoms is good.  She has elected to proceed with surgery.  Mary Sella. Andrey Campanile, MD, FACS General, Bariatric, & Minimally Invasive Surgery [facility], [place]         [facility] M 11/15/2012, 9:41 AM

## 2012-11-15 NOTE — Patient Instructions (Signed)
Cholelithiasis Cholelithiasis (also called gallstones) is a form of gallbladder disease where gallstones form in your gallbladder. The gallbladder is a non-essential organ that stores bile made in the liver, which helps digest fats. Gallstones begin as small crystals and slowly grow into stones. Gallstone pain occurs when the gallbladder spasms, and a gallstone is blocking the duct. Pain can also occur when a stone passes out of the duct.  Women are more likely to develop gallstones than men. Other factors that increase the risk of gallbladder disease are:  Having multiple pregnancies. Physicians sometimes advise removing diseased gallbladders before future pregnancies.  Obesity.  Diets heavy in fried foods and fat.  Increasing age (older than 11).  Prolonged use of medications containing female hormones.  Diabetes mellitus.  Rapid weight loss.  Family history of gallstones (heredity). SYMPTOMS  Feeling sick to your stomach (nauseous).  Abdominal pain.  Yellowing of the skin (jaundice).  Sudden pain. It may persist from several minutes to several hours.  Worsening pain with deep breathing or when jarred.  Fever.  Tenderness to the touch. In some cases, when gallstones do not move into the bile duct, people have no pain or symptoms. These are called "silent" gallstones. TREATMENT In severe cases, emergency surgery may be required. HOME CARE INSTRUCTIONS   Only take over-the-counter or prescription medicines for pain, discomfort, or fever as directed by your caregiver.  Follow a low-fat diet until seen again. Fat causes the gallbladder to contract, which can result in pain.  Follow up as instructed. Attacks are almost always recurrent and surgery is usually required for permanent treatment. SEEK IMMEDIATE MEDICAL CARE IF:   Your pain increases and is not controlled by medications and lasts longer than 12 hours  You have an oral temperature above 102 F (38.9 C), not  controlled by medication.  You develop persistent nausea and vomiting. MAKE SURE YOU:   Understand these instructions.  Will watch your condition.  Will get help right away if you are not doing well or get worse. Document Released: 07/10/2005 Document Revised: 10/06/2011 Document Reviewed: 09/12/2010 [facility] Patient Information 2013 [place], [place].

## 2012-11-22 ENCOUNTER — Ambulatory Visit (INDEPENDENT_AMBULATORY_CARE_PROVIDER_SITE_OTHER): Payer: BC Managed Care – PPO | Admitting: Family

## 2012-11-22 ENCOUNTER — Encounter: Payer: Self-pay | Admitting: Family

## 2012-11-22 VITALS — BP 118/86 | HR 87 | Temp 98.6°F | Resp 16 | Ht 68.0 in | Wt 172.0 lb

## 2012-11-22 DIAGNOSIS — K8020 Calculus of gallbladder without cholecystitis without obstruction: Secondary | ICD-10-CM

## 2012-11-22 DIAGNOSIS — R7309 Other abnormal glucose: Secondary | ICD-10-CM

## 2012-11-22 DIAGNOSIS — F411 Generalized anxiety disorder: Secondary | ICD-10-CM

## 2012-11-22 DIAGNOSIS — R739 Hyperglycemia, unspecified: Secondary | ICD-10-CM

## 2012-11-22 DIAGNOSIS — R7303 Prediabetes: Secondary | ICD-10-CM

## 2012-11-22 DIAGNOSIS — N6320 Unspecified lump in the left breast, unspecified quadrant: Secondary | ICD-10-CM

## 2012-11-22 DIAGNOSIS — E039 Hypothyroidism, unspecified: Secondary | ICD-10-CM | POA: Insufficient documentation

## 2012-11-22 DIAGNOSIS — N63 Unspecified lump in unspecified breast: Secondary | ICD-10-CM

## 2012-11-22 DIAGNOSIS — R002 Palpitations: Secondary | ICD-10-CM

## 2012-11-22 LAB — TSH: TSH: 1.102 u[IU]/mL (ref 0.350–4.500)

## 2012-11-22 NOTE — Progress Notes (Signed)
  Subjective:    Patient ID: Celso Sickle, female    DOB: May 27, 1966, 47 y.o.   MRN: [id number]  HPI  Ms. Olexa is a 47 yr old female who presents today for ED follow up.  She developed severe abdominal pain on 4/13 and was brought to ED via EMS.  LFT's were noted to be elevated. A gallstone was noted on [place].  She is tolerating PO's.    She has a mammogram appointment.    Anxiety- reports that about 14 yrs a go had panic attack.  Had another panic attack the other day when she was leaving the surgeon's office.  Reports intermittent palpitations.  Feels like she often worries a lot about things.  Wonder's if she should be treated for anxiety.     Review of Systems    see HPI  Past Medical History  Diagnosis Date  . Hx gestational diabetes   . Borderline diabetes 09/19/2011  . Kidney stones     History   Social History  . Marital Status: Married    Spouse Name: N/A    Number of Children: N/A  . Years of Education: N/A   Occupational History  . Not on file.   Social History Main Topics  . Smoking status: Former Games developer  . Smokeless tobacco: Never Used  . Alcohol Use: Yes  . Drug Use: No  . Sexually Active: Not on file   Other Topics Concern  . Not on file   Social History Narrative   Adopted          Past Surgical History  Procedure Laterality Date  . Other surgical history  11/2004  . Lithotripsy    . Tubal ligation  1992    reversal in 2007    Family History  Problem Relation Age of Onset  . Adopted: Yes    No Known Allergies  Current Outpatient Prescriptions on File Prior to Visit  Medication Sig Dispense Refill  . cetirizine (ZYRTEC) 10 MG tablet TAKE 1 TABLET (10 MG TOTAL) BY MOUTH DAILY.  30 tablet  11  . HYDROcodone-acetaminophen (NORCO/VICODIN) 5-325 MG per tablet Take 1 tablet by mouth every 6 (six) hours as needed for pain.  10 tablet  0  . ondansetron (ZOFRAN ODT) 8 MG disintegrating tablet Take 1 tablet (8 mg total) by mouth every 8  (eight) hours as needed for nausea.  20 tablet  0   No current facility-administered medications on file prior to visit.    BP 118/86  Pulse 87  Temp(Src) 98.6 F (37 C) (Oral)  Resp 16  Ht 5\' 8"  (1.727 m)  Wt 172 lb (78.019 kg)  BMI 26.16 kg/m2  SpO2 99%  LMP 11/07/2012    Objective:   Physical Exam  Constitutional: She appears well-developed and well-nourished. No distress.  HENT:  Head: Normocephalic and atraumatic.  Cardiovascular: Normal rate and regular rhythm.   No murmur heard. Pulmonary/Chest: Effort normal and breath sounds normal. No respiratory distress. She has no wheezes. She has no rales. She exhibits no tenderness.  Musculoskeletal: She exhibits no edema.  Lymphadenopathy:    She has no cervical adenopathy.  Psychiatric:  tearful          Assessment & Plan:  >25 minutes spent with pt today.  >50% of this time was spent counseling pt on anxiety.

## 2012-11-22 NOTE — Patient Instructions (Addendum)
Please follow up in 3 months. Call if you would like a referral to a therapist or if you would like a prescription for your anxiety. Complete lab work prior to leaving.

## 2012-11-22 NOTE — Assessment & Plan Note (Signed)
We discussed rx with SSRI versus referral to therapist.  At this point she will think about it and let me know what she decides.

## 2012-11-22 NOTE — Assessment & Plan Note (Signed)
Follow up study was negative.  She will complete routine screening mammogram as scheduled.

## 2012-11-22 NOTE — Assessment & Plan Note (Signed)
Check A1C 

## 2012-11-22 NOTE — Assessment & Plan Note (Signed)
She is schedule for elective cholecystectomy next week.

## 2012-11-23 ENCOUNTER — Encounter: Payer: Self-pay | Admitting: Family

## 2012-11-24 NOTE — Pre-Procedure Instructions (Signed)
ANNALYCE LANPHER  11/24/2012   Your procedure is scheduled on:  Monday, May 5th  Report to [facility] at 0800 AM.  Call this number if you have problems the morning of surgery: [phone]   Remember:   Do not eat food or drink liquids after midnight.    Take these medicines the morning of surgery with A SIP OF WATER: Zyrtec, Vicodin if needed   Do not wear jewelry, make-up or nail polish.  Do not wear lotions, powders, or perfumes.,deodorant.  Do not shave 48 hours prior to surgery.  Do not bring valuables to the hospital.  Contacts, dentures or bridgework may not be worn into surgery.  Leave suitcase in the car. After surgery it may be brought to your room.  For patients admitted to the hospital, checkout time is 11:00 AM the day of discharge.   Patients discharged the day of surgery will not be allowed to drive home.    Special Instructions: Shower using CHG 2 nights before surgery and the night before surgery.  If you shower the day of surgery use CHG.  Use special wash - you have one bottle of CHG for all showers.  You should use approximately 1/3 of the bottle for each shower.   Please read over the following fact sheets that you were given: Pain Booklet, Coughing and Deep Breathing, MRSA Information and Surgical Site Infection Prevention

## 2012-11-25 ENCOUNTER — Encounter (HOSPITAL_COMMUNITY)
Admission: RE | Admit: 2012-11-25 | Discharge: 2012-11-25 | Disposition: A | Discharge status: Home / Self Care | Payer: BC Managed Care – PPO | Source: Ambulatory Visit | Attending: General Surgery | Admitting: General Surgery

## 2012-11-25 ENCOUNTER — Encounter (HOSPITAL_COMMUNITY): Payer: Self-pay

## 2012-11-25 ENCOUNTER — Other Ambulatory Visit (HOSPITAL_COMMUNITY): Payer: BC Managed Care – PPO

## 2012-11-25 HISTORY — DX: Palpitations: R00.2

## 2012-11-25 HISTORY — DX: Anxiety disorder, unspecified: F41.9

## 2012-11-25 HISTORY — DX: Panic disorder (episodic paroxysmal anxiety): F41.0

## 2012-11-25 HISTORY — DX: Personal history of urinary calculi: Z87.442

## 2012-11-25 LAB — CBC
Hemoglobin: 12.4 g/dL (ref 12.0–15.0)
Platelets: 193 10*3/uL (ref 150–400)
RBC: 4.55 MIL/uL (ref 3.87–5.11)
WBC: 4.9 10*3/uL (ref 4.0–10.5)

## 2012-11-25 LAB — COMPREHENSIVE METABOLIC PANEL
BUN: 12 mg/dL (ref 6–23)
Calcium: 9.5 mg/dL (ref 8.4–10.5)
Creatinine, Ser: 0.6 mg/dL (ref 0.50–1.10)
GFR calc Af Amer: >90 mL/min (ref >90)
GFR calc non Af Amer: >90 mL/min (ref >90)
Glucose, Bld: 160 mg/dL — ABNORMAL HIGH (ref 70–99)
Sodium: 139 mEq/L (ref 135–145)
Total Protein: 7 g/dL (ref 6.0–8.3)

## 2012-11-25 LAB — SURGICAL PCR SCREEN
MRSA, PCR: NEGATIVE
Staphylococcus aureus: NEGATIVE

## 2012-11-25 LAB — HCG, SERUM, QUALITATIVE: Preg, Serum: NEGATIVE

## 2012-11-28 MED ORDER — DEXTROSE 5 % IV SOLN
2.0000 g | INTRAVENOUS | Status: AC
  Administered 2012-11-29: 2 g via INTRAVENOUS
  Filled 2012-11-28: qty 2

## 2012-11-29 ENCOUNTER — Ambulatory Visit (HOSPITAL_COMMUNITY): Payer: BC Managed Care – PPO | Admitting: Anesthesiology

## 2012-11-29 ENCOUNTER — Ambulatory Visit (HOSPITAL_COMMUNITY): Payer: BC Managed Care – PPO

## 2012-11-29 ENCOUNTER — Encounter (HOSPITAL_COMMUNITY): Payer: Self-pay | Admitting: *Deleted

## 2012-11-29 ENCOUNTER — Ambulatory Visit (HOSPITAL_COMMUNITY)
Admission: RE | Admit: 2012-11-29 | Discharge: 2012-11-29 | Disposition: A | Discharge status: Home / Self Care | Payer: BC Managed Care – PPO | Source: Ambulatory Visit | Attending: General Surgery | Admitting: General Surgery

## 2012-11-29 ENCOUNTER — Encounter (HOSPITAL_COMMUNITY)
Admission: RE | Disposition: A | Discharge status: Home / Self Care | Payer: Self-pay | Source: Ambulatory Visit | Attending: General Surgery

## 2012-11-29 ENCOUNTER — Encounter (HOSPITAL_COMMUNITY): Payer: Self-pay | Admitting: Vascular Surgery

## 2012-11-29 DIAGNOSIS — K8010 Calculus of gallbladder with chronic cholecystitis without obstruction: Secondary | ICD-10-CM

## 2012-11-29 DIAGNOSIS — R7309 Other abnormal glucose: Secondary | ICD-10-CM | POA: Insufficient documentation

## 2012-11-29 DIAGNOSIS — Z87442 Personal history of urinary calculi: Secondary | ICD-10-CM | POA: Insufficient documentation

## 2012-11-29 HISTORY — PX: CHOLECYSTECTOMY: SHX55

## 2012-11-29 SURGERY — LAPAROSCOPIC CHOLECYSTECTOMY WITH INTRAOPERATIVE CHOLANGIOGRAM
Anesthesia: General | Site: Abdomen | Wound class: Clean Contaminated

## 2012-11-29 MED ORDER — MORPHINE SULFATE 10 MG/ML IJ SOLN
INTRAMUSCULAR | Status: DC | PRN
  Administered 2012-11-29 (×2): 5 mg via INTRAVENOUS

## 2012-11-29 MED ORDER — ONDANSETRON HCL 4 MG/2ML IJ SOLN: 4.0000 mg | Freq: Four times a day (QID) | INTRAMUSCULAR | Status: DC | PRN

## 2012-11-29 MED ORDER — SODIUM CHLORIDE 0.9 % IV SOLN: 250.0000 mL | INTRAVENOUS | Status: DC | PRN

## 2012-11-29 MED ORDER — DEXTROSE 5 % IV SOLN
INTRAVENOUS | Status: AC
  Filled 2012-11-29 (×2): qty 1

## 2012-11-29 MED ORDER — MEPERIDINE HCL 25 MG/ML IJ SOLN: 6.2500 mg | INTRAMUSCULAR | Status: DC | PRN

## 2012-11-29 MED ORDER — SODIUM CHLORIDE 0.9 % IR SOLN
Status: DC | PRN
  Administered 2012-11-29: 1

## 2012-11-29 MED ORDER — SODIUM CHLORIDE 0.9 % IJ SOLN: 3.0000 mL | INTRAMUSCULAR | Status: DC | PRN

## 2012-11-29 MED ORDER — LACTATED RINGERS IV SOLN
INTRAVENOUS | Status: DC
  Administered 2012-11-29: 10:00:00 via INTRAVENOUS

## 2012-11-29 MED ORDER — HYDROMORPHONE HCL PF 1 MG/ML IJ SOLN
INTRAMUSCULAR | Status: AC
  Filled 2012-11-29: qty 1

## 2012-11-29 MED ORDER — MORPHINE SULFATE 2 MG/ML IJ SOLN: 1.0000 mg | INTRAMUSCULAR | Status: DC | PRN

## 2012-11-29 MED ORDER — HYDROMORPHONE HCL PF 1 MG/ML IJ SOLN
INTRAMUSCULAR | Status: DC | PRN
  Administered 2012-11-29: 1 mg via INTRAVENOUS

## 2012-11-29 MED ORDER — ACETAMINOPHEN 325 MG PO TABS: 650.0000 mg | ORAL_TABLET | ORAL | Status: DC | PRN

## 2012-11-29 MED ORDER — FENTANYL CITRATE 0.05 MG/ML IJ SOLN
INTRAMUSCULAR | Status: DC | PRN
  Administered 2012-11-29 (×5): 50 ug via INTRAVENOUS

## 2012-11-29 MED ORDER — OXYCODONE HCL 5 MG PO TABS
5.0000 mg | ORAL_TABLET | Freq: Once | ORAL | Status: AC | PRN
  Administered 2012-11-29: 5 mg via ORAL

## 2012-11-29 MED ORDER — PROMETHAZINE HCL 25 MG/ML IJ SOLN
12.5000 mg | Freq: Four times a day (QID) | INTRAMUSCULAR | Status: DC | PRN
  Administered 2012-11-29: 12.5 mg via INTRAVENOUS

## 2012-11-29 MED ORDER — NEOSTIGMINE METHYLSULFATE 1 MG/ML IJ SOLN
INTRAMUSCULAR | Status: DC | PRN
  Administered 2012-11-29: 5 mg via INTRAVENOUS

## 2012-11-29 MED ORDER — PROPOFOL 10 MG/ML IV BOLUS
INTRAVENOUS | Status: DC | PRN
  Administered 2012-11-29: 200 mg via INTRAVENOUS

## 2012-11-29 MED ORDER — PROMETHAZINE HCL 25 MG/ML IJ SOLN
6.2500 mg | INTRAMUSCULAR | Status: DC | PRN
  Filled 2012-11-29: qty 1

## 2012-11-29 MED ORDER — KETOROLAC TROMETHAMINE 30 MG/ML IJ SOLN
30.0000 mg | Freq: Four times a day (QID) | INTRAMUSCULAR | Status: DC
  Administered 2012-11-29: 30 mg via INTRAVENOUS

## 2012-11-29 MED ORDER — HYDROMORPHONE HCL PF 1 MG/ML IJ SOLN
0.2500 mg | INTRAMUSCULAR | Status: DC | PRN
  Administered 2012-11-29: 0.5 mg via INTRAVENOUS

## 2012-11-29 MED ORDER — ROCURONIUM BROMIDE 100 MG/10ML IV SOLN
INTRAVENOUS | Status: DC | PRN
  Administered 2012-11-29: 50 mg via INTRAVENOUS

## 2012-11-29 MED ORDER — OXYCODONE HCL 5 MG PO TABS
ORAL_TABLET | ORAL | Status: AC
  Filled 2012-11-29: qty 1

## 2012-11-29 MED ORDER — OXYCODONE HCL 5 MG/5ML PO SOLN: 5.0000 mg | Freq: Once | ORAL | Status: AC | PRN

## 2012-11-29 MED ORDER — BUPIVACAINE-EPINEPHRINE PF 0.25-1:200000 % IJ SOLN
INTRAMUSCULAR | Status: AC
  Filled 2012-11-29: qty 30

## 2012-11-29 MED ORDER — LIDOCAINE HCL (CARDIAC) 20 MG/ML IV SOLN
INTRAVENOUS | Status: DC | PRN
  Administered 2012-11-29: 80 mg via INTRAVENOUS

## 2012-11-29 MED ORDER — OXYCODONE-ACETAMINOPHEN 5-325 MG PO TABS: 1.0000 | ORAL_TABLET | ORAL | Status: DC | PRN

## 2012-11-29 MED ORDER — GLYCOPYRROLATE 0.2 MG/ML IJ SOLN
INTRAMUSCULAR | Status: DC | PRN
  Administered 2012-11-29: .8 mg via INTRAVENOUS

## 2012-11-29 MED ORDER — SODIUM CHLORIDE 0.9 % IV SOLN
INTRAVENOUS | Status: DC | PRN
  Administered 2012-11-29: 11:00:00

## 2012-11-29 MED ORDER — KETOROLAC TROMETHAMINE 30 MG/ML IJ SOLN
INTRAMUSCULAR | Status: AC
  Filled 2012-11-29: qty 1

## 2012-11-29 MED ORDER — LACTATED RINGERS IV SOLN
INTRAVENOUS | Status: DC | PRN
  Administered 2012-11-29 (×2): via INTRAVENOUS

## 2012-11-29 MED ORDER — BUPIVACAINE-EPINEPHRINE 0.25% -1:200000 IJ SOLN
INTRAMUSCULAR | Status: DC | PRN
  Administered 2012-11-29: 16 mL

## 2012-11-29 MED ORDER — SODIUM CHLORIDE 0.9 % IV SOLN: INTRAVENOUS | Status: DC

## 2012-11-29 MED ORDER — ACETAMINOPHEN 10 MG/ML IV SOLN
INTRAVENOUS | Status: AC
  Filled 2012-11-29: qty 100

## 2012-11-29 MED ORDER — ACETAMINOPHEN 650 MG RE SUPP: 650.0000 mg | RECTAL | Status: DC | PRN

## 2012-11-29 MED ORDER — MIDAZOLAM HCL 5 MG/5ML IJ SOLN
INTRAMUSCULAR | Status: DC | PRN
  Administered 2012-11-29: 2 mg via INTRAVENOUS

## 2012-11-29 MED ORDER — SODIUM CHLORIDE 0.9 % IJ SOLN: 3.0000 mL | Freq: Two times a day (BID) | INTRAMUSCULAR | Status: DC

## 2012-11-29 MED ORDER — ONDANSETRON HCL 4 MG/2ML IJ SOLN
INTRAMUSCULAR | Status: DC | PRN
  Administered 2012-11-29: 4 mg via INTRAVENOUS

## 2012-11-29 MED ORDER — ACETAMINOPHEN 10 MG/ML IV SOLN
INTRAVENOUS | Status: DC | PRN
  Administered 2012-11-29: 1000 mg via INTRAVENOUS

## 2012-11-29 MED ORDER — OXYCODONE HCL 5 MG PO TABS: 5.0000 mg | ORAL_TABLET | ORAL | Status: DC | PRN

## 2012-11-29 SURGICAL SUPPLY — 41 items
APPLIER CLIP 5 13 M/L LIGAMAX5 (MISCELLANEOUS) ×2
BANDAGE ADHESIVE 1X3 (GAUZE/BANDAGES/DRESSINGS) IMPLANT
BENZOIN TINCTURE PRP APPL 2/3 (GAUZE/BANDAGES/DRESSINGS) IMPLANT
BLADE SURG ROTATE 9660 (MISCELLANEOUS) IMPLANT
CANISTER SUCTION 2500CC (MISCELLANEOUS) ×2 IMPLANT
CHLORAPREP W/TINT 26ML (MISCELLANEOUS) ×2 IMPLANT
CLIP APPLIE 5 13 M/L LIGAMAX5 (MISCELLANEOUS) ×1 IMPLANT
CLOTH BEACON ORANGE TIMEOUT ST (SAFETY) ×2 IMPLANT
COVER MAYO STAND STRL (DRAPES) ×2 IMPLANT
COVER SURGICAL LIGHT HANDLE (MISCELLANEOUS) ×2 IMPLANT
DECANTER SPIKE VIAL GLASS SM (MISCELLANEOUS) ×2 IMPLANT
DRAPE C-ARM 42X72 X-RAY (DRAPES) ×2 IMPLANT
DRAPE UTILITY 15X26 W/TAPE STR (DRAPE) ×4 IMPLANT
DRSG TEGADERM 4X4.75 (GAUZE/BANDAGES/DRESSINGS) IMPLANT
ELECT REM PT RETURN 9FT ADLT (ELECTROSURGICAL) ×2
ELECTRODE REM PT RTRN 9FT ADLT (ELECTROSURGICAL) ×1 IMPLANT
GAUZE SPONGE 2X2 8PLY STRL LF (GAUZE/BANDAGES/DRESSINGS) IMPLANT
GLOVE BIO SURGEON STRL SZ7.5 (GLOVE) ×6 IMPLANT
GLOVE BIOGEL M STRL SZ7.5 (GLOVE) ×2 IMPLANT
GLOVE BIOGEL PI IND STRL 8 (GLOVE) ×2 IMPLANT
GLOVE BIOGEL PI INDICATOR 8 (GLOVE) ×2
GOWN PREVENTION PLUS XLARGE (GOWN DISPOSABLE) ×2 IMPLANT
GOWN STRL NON-REIN LRG LVL3 (GOWN DISPOSABLE) ×4 IMPLANT
KIT BASIN OR (CUSTOM PROCEDURE TRAY) ×2 IMPLANT
KIT ROOM TURNOVER OR (KITS) ×2 IMPLANT
NS IRRIG 1000ML POUR BTL (IV SOLUTION) ×2 IMPLANT
PAD ARMBOARD 7.5X6 YLW CONV (MISCELLANEOUS) ×2 IMPLANT
POUCH SPECIMEN RETRIEVAL 10MM (ENDOMECHANICALS) ×2 IMPLANT
SCISSORS LAP 5X35 DISP (ENDOMECHANICALS) IMPLANT
SET CHOLANGIOGRAPH 5 50 .035 (SET/KITS/TRAYS/PACK) ×2 IMPLANT
SET IRRIG TUBING LAPAROSCOPIC (IRRIGATION / IRRIGATOR) ×2 IMPLANT
SLEEVE ENDOPATH XCEL 5M (ENDOMECHANICALS) ×4 IMPLANT
SPECIMEN JAR SMALL (MISCELLANEOUS) ×2 IMPLANT
SPONGE GAUZE 2X2 STER 10/PKG (GAUZE/BANDAGES/DRESSINGS)
SUT MNCRL AB 4-0 PS2 18 (SUTURE) ×2 IMPLANT
SUT VICRYL 0 UR6 27IN ABS (SUTURE) ×2 IMPLANT
TOWEL OR 17X24 6PK STRL BLUE (TOWEL DISPOSABLE) ×2 IMPLANT
TOWEL OR 17X26 10 PK STRL BLUE (TOWEL DISPOSABLE) ×2 IMPLANT
TRAY LAPAROSCOPIC (CUSTOM PROCEDURE TRAY) ×2 IMPLANT
TROCAR XCEL BLUNT TIP 100MML (ENDOMECHANICALS) ×2 IMPLANT
TROCAR XCEL NON-BLD 5MMX100MML (ENDOMECHANICALS) ×2 IMPLANT

## 2012-11-29 NOTE — Progress Notes (Signed)
Patient reports having her typical heart palpitations that she normally has when she gets anxious. Patient denies shortness of breath or other complaints. Dr. Krista Blue made aware no further orders received.

## 2012-11-29 NOTE — Interval H&P Note (Signed)
History and Physical Interval Note:  11/29/2012 9:56 AM  [name]  has presented today for surgery, with the diagnosis of symptomatic cholelithiasis  The various methods of treatment have been discussed with the patient and family. After consideration of risks, benefits and other options for treatment, the patient has consented to  Procedure(s): LAPAROSCOPIC CHOLECYSTECTOMY WITH INTRAOPERATIVE CHOLANGIOGRAM (N/A) as a surgical intervention .  The patient's history has been reviewed, patient examined, no change in status, stable for surgery.  I have reviewed the patient's chart and labs.  Questions were answered to the patient's satisfaction.    Mary Sella. Andrey Campanile, MD, FACS General, Bariatric, & Minimally Invasive Surgery [facility], [place]  [facility] M

## 2012-11-29 NOTE — Op Note (Signed)
Laparoscopic Cholecystectomy with IOC Procedure Note  Indications: This patient presents with symptomatic gallbladder disease and will undergo laparoscopic cholecystectomy.  Pre-operative Diagnosis: Calculus of gallbladder without mention of cholecystitis or obstruction  Post-operative Diagnosis: Same  Surgeon: Atilano Ina   Assistants: none  Anesthesia: General endotracheal anesthesia  ASA Class: 2  Procedure Details  The patient was seen again in the Holding Room. The risks, benefits, complications, treatment options, and expected outcomes were discussed with the patient. The possibilities of reaction to medication, pulmonary aspiration, perforation of viscus, bleeding, recurrent infection, finding a normal gallbladder, the need for additional procedures, failure to diagnose a condition, the possible need to convert to an open procedure, and creating a complication requiring transfusion or operation were discussed with the patient. The likelihood of improving the patient's symptoms with return to their baseline status is good.  The patient and/or family concurred with the proposed plan, giving informed consent. The site of surgery properly noted. The patient was taken to Operating Room, identified as [name] and the procedure verified as Laparoscopic Cholecystectomy with Intraoperative Cholangiogram. A Time Out was held and the above information confirmed.  Prior to the induction of general anesthesia, antibiotic prophylaxis was administered. General endotracheal anesthesia was then administered and tolerated well. After the induction, the abdomen was prepped with Chloraprep and draped in the sterile fashion. The patient was positioned in the supine position.  Local anesthetic agent was injected into the skin near the umbilicus thru an old transverse incision made. We dissected down to the abdominal fascia with blunt dissection.  The fascia was incised vertically and we entered the  peritoneal cavity bluntly.  A pursestring suture of 0-Vicryl was placed around the fascial opening.  The Hasson cannula was inserted and secured with the stay suture.  Pneumoperitoneum was then created with CO2 and tolerated well without any adverse changes in the patient's vital signs. An 5-mm port was placed in the subxiphoid position.  Two 5-mm ports were placed in the right upper quadrant. All skin incisions were infiltrated with a local anesthetic agent before making the incision and placing the trocars.   We positioned the patient in reverse Trendelenburg, tilted slightly to the patient's left.  The gallbladder was identified, the fundus grasped and retracted cephalad. Adhesions were lysed bluntly and with the electrocautery where indicated, taking care not to injure any adjacent organs or viscus. The infundibulum was grasped and retracted laterally, exposing the peritoneum overlying the triangle of Calot. This was then divided and exposed in a blunt fashion. A critical view of the cystic duct and cystic artery was obtained.  The cystic duct was clearly identified and bluntly dissected circumferentially. The cystic duct was ligated with a clip distally.   An incision was made in the cystic duct and the [facility] cholangiogram catheter introduced. The catheter was secured using a clip. A cholangiogram was then obtained which showed good visualization of the distal and proximal biliary tree with no sign of filling defects or obstruction.  Contrast flowed easily into the duodenum. The catheter was then removed.   The cystic duct was then ligated with clips and divided. The cystic artery was identified, dissected free, ligated with clips and divided as well.   The gallbladder was dissected from the liver bed in retrograde fashion with the electrocautery.  I did place 1 clip across a small vessel entering the gallbladder in the middle of the gallbladder fossa. The gallbladder was removed and placed in an Endocatch  sac.  The  gallbladder and Endocatch sac were then removed through the umbilical port site. The liver bed was irrigated and inspected. Hemostasis was achieved with the electrocautery. Copious irrigation was utilized and was repeatedly aspirated until clear.  The pursestring suture was used to close the umbilical fascia.    We again inspected the right upper quadrant for hemostasis.  The umbilical closure was inspected and there was no air leak and nothing trapped within the closure. However there was a little weakness so I placed an additional figure of eight 0-vicryl suture to reinforce the closure. This was confirmed laparoscopically. Pneumoperitoneum was released as we removed the trocars.  4-0 Monocryl was used to close the skin.   Benzoin, steri-strips, and clean dressings were applied. The patient was then extubated and brought to the recovery room in stable condition. Instrument, sponge, and needle counts were correct at closure and at the conclusion of the case.   Findings:  Cholelithiasis; +critical view. Small adhesion of sigmoid colon to lower abd wall  Estimated Blood Loss: Minimal         Drains: none         Specimens: Gallbladder           Complications: None; patient tolerated the procedure well.         Disposition: PACU - hemodynamically stable.         Condition: stable  [name]. Andrey Campanile, MD, FACS General, Bariatric, & Minimally Invasive Surgery [facility], [place]

## 2012-11-29 NOTE — H&P (View-Only) (Signed)
Patient ID: [name], female   DOB: 05/09/1966, 47 y.o.   MRN: [id number]  Chief Complaint  Patient presents with  . New Evaluation    eval GB    HPI [name] is a 47 y.o. female.   HPI 47 yo female referred by Dr Nanavati for evaluation of gallstones. The patient states that she has had intermittent symptoms on and off for the past 7 years. It started with her last pregnancy. She describes it as a intermittent ache in her epigastric area. Generally it would last an hour or 2 but eventually resolved. However on April 13, she developed severe pain in the epigastric area associated with nausea vomiting. She actually called the ambulance and was taken to  hospital. There she was evaluated and was found to have cholelithiasis with some mild LFT elevation. Since the other week she has remained symptom free. She denies any fevers or chills. She denies any jaundice. She denies any melena, hematochezia, or acholic stools. She denies any NSAID use. She is taking the pain medication once or twice since leaving the emergency room but has not had any severe episodes like she had the other day. She denies any weight loss. She has had some bloating as well.  Past Medical History  Diagnosis Date  . Hx gestational diabetes   . Borderline diabetes 09/19/2011  . Kidney stones     Past Surgical History  Procedure Laterality Date  . Other surgical history  11/2004  . Lithotripsy    . Tubal ligation  1992    reversal in 2007    Family History  Problem Relation Age of Onset  . Adopted: Yes  . Family history unknown: Yes    Social History History  Substance Use Topics  . Smoking status: Former Smoker  . Smokeless tobacco: Never Used  . Alcohol Use: Yes    No Known Allergies  Current Outpatient Prescriptions  Medication Sig Dispense Refill  . cetirizine (ZYRTEC) 10 MG tablet TAKE 1 TABLET (10 MG TOTAL) BY MOUTH DAILY.  30 tablet  11  . HYDROcodone-acetaminophen  (NORCO/VICODIN) 5-325 MG per tablet Take 1 tablet by mouth every 6 (six) hours as needed for pain.  10 tablet  0  . ondansetron (ZOFRAN ODT) 8 MG disintegrating tablet Take 1 tablet (8 mg total) by mouth every 8 (eight) hours as needed for nausea.  20 tablet  0   No current facility-administered medications for this visit.    Review of Systems Review of Systems  Constitutional: Negative for fever, chills and unexpected weight change.  HENT: Negative for hearing loss, congestion, sore throat, trouble swallowing and voice change.   Eyes: Negative for visual disturbance.  Respiratory: Negative for cough, shortness of breath and wheezing.   Cardiovascular: Negative for chest pain, palpitations and leg swelling.  Gastrointestinal: Positive for nausea, vomiting and abdominal pain. Negative for diarrhea, constipation, blood in stool, abdominal distention and anal bleeding.  Genitourinary: Negative for hematuria, vaginal bleeding and difficulty urinating.  Musculoskeletal: Negative for arthralgias.  Skin: Negative for rash and wound.  Neurological: Negative for seizures, syncope and headaches.  Hematological: Negative for adenopathy. Does not bruise/bleed easily.  Psychiatric/Behavioral: Negative for confusion.    Blood pressure 146/60, pulse 80, temperature 98.7 F (37.1 C), temperature source Temporal, resp. rate 12, height 5' 7" (1.702 m), weight 169 lb (76.658 kg), last menstrual period 11/07/2012.  Physical Exam Physical Exam  Vitals reviewed. Constitutional: She is oriented to person, place, and time.   She appears well-developed and well-nourished. No distress.  HENT:  Head: Normocephalic and atraumatic.  Right Ear: External ear normal.  Left Ear: External ear normal.  Eyes: Conjunctivae are normal. No scleral icterus.  Neck: Normal range of motion. Neck supple. No tracheal deviation present. No thyromegaly present.  Cardiovascular: Normal rate, regular rhythm and normal heart sounds.    Pulmonary/Chest: Effort normal and breath sounds normal. No respiratory distress. She has no wheezes.  Abdominal: Soft. She exhibits no distension. There is no tenderness.    Musculoskeletal: She exhibits no edema and no tenderness.  Lymphadenopathy:    She has no cervical adenopathy.  Neurological: She is alert and oriented to person, place, and time.  Skin: Skin is warm and dry. No rash noted. She is not diaphoretic. No erythema.  Psychiatric: She has a normal mood and affect. Her behavior is normal. Judgment and thought content normal.    Data Reviewed Ed note Labs from 4/13 - nml cbc; AST 51, ALT 42; AP 158  COMPLETE ABDOMINAL ULTRASOUND 11/07/12 Comparison: None.  Findings:  Gallbladder: A 1.4 cm mobile stone is identified the gallbladder.  There is no gallbladder wall thickening or pericholecystic fluid.  Sonographer reports negative Murphy's sign.  Common bile duct: Measures 0.6 cm.  Liver: No focal lesion identified. Within normal limits in  parenchymal echogenicity.  IVC: Appears normal.  Pancreas: No focal abnormality seen.  Spleen: Measures 6.8 cm and appears normal.  Right Kidney: Measures 10.7 cm and appears normal.  Left Kidney: Measures 11.1 cm and appears normal.  Abdominal aorta: No aneurysm identified.  IMPRESSION:  A single 1.4 cm mobile gallstone is identified without evidence of  cholecystitis.    Assessment    Symptomatic cholelithiasis     Plan    I believe the patient's symptoms are consistent with gallbladder disease.  We discussed gallbladder disease. The patient was given educational material. We discussed non-operative and operative management. We discussed the signs & symptoms of acute cholecystitis  I discussed laparoscopic cholecystectomy with IOC in detail.  The patient was given educational material as well as diagrams detailing the procedure.  We discussed the risks and benefits of a laparoscopic cholecystectomy including, but not  limited to bleeding, infection, injury to surrounding structures such as the intestine or liver, bile leak, retained gallstones, need to convert to an open procedure, prolonged diarrhea, blood clots such as  DVT, common bile duct injury, anesthesia risks, and possible need for additional procedures.  We discussed the typical post-operative recovery course. I explained that the likelihood of improvement of their symptoms is good.  She has elected to proceed with surgery.  Mirakle Tomlin M. Batsheva Stevick, MD, FACS General, Bariatric, & Minimally Invasive Surgery [facility] [place]         Clark Cuff M 11/15/2012, 9:41 AM    

## 2012-11-29 NOTE — Progress Notes (Signed)
Pt with nausea. Dr. Andrey Campanile paged, order for phenergan received and given per order.

## 2012-11-29 NOTE — Transfer of Care (Signed)
Immediate Anesthesia Transfer of Care Note  Patient: [name]  Procedure(s) Performed: Procedure(s): LAPAROSCOPIC CHOLECYSTECTOMY WITH INTRAOPERATIVE CHOLANGIOGRAM (N/A)  Patient Location: PACU  Anesthesia Type:General  Level of Consciousness: awake, alert  and oriented  Airway & Oxygen Therapy: Patient Spontanous Breathing and Patient connected to nasal cannula oxygen  Post-op Assessment: Report given to PACU RN and Post -op Vital signs reviewed and stable  Post vital signs: Reviewed and stable  Complications: No apparent anesthesia complications

## 2012-11-29 NOTE — Preoperative (Signed)
Beta Blockers   Reason not to administer Beta Blockers:Not Applicable 

## 2012-11-29 NOTE — Anesthesia Preprocedure Evaluation (Addendum)
Anesthesia Evaluation  Patient identified by MRN, date of birth, ID band Patient awake    Reviewed: Allergy & Precautions, H&P , NPO status , Patient's Chart, lab work & pertinent test results, reviewed documented beta blocker date and time   Airway Mallampati: II TM Distance: >3 FB Neck ROM: Full    Dental  (+) Teeth Intact and Dental Advisory Given   Pulmonary former smoker,  breath sounds clear to auscultation        Cardiovascular + dysrhythmias (palpitations) Rhythm:Regular Rate:Normal     Neuro/Psych PSYCHIATRIC DISORDERS Anxiety negative neurological ROS     GI/Hepatic negative GI ROS, Neg liver ROS,   Endo/Other  negative endocrine ROS  Renal/GU negative Renal ROS     Musculoskeletal negative musculoskeletal ROS (+)   Abdominal   Peds  Hematology negative hematology ROS (+)   Anesthesia Other Findings   Reproductive/Obstetrics negative OB ROS                         Anesthesia Physical Anesthesia Plan  ASA: II  Anesthesia Plan: General   Post-op Pain Management:    Induction: Intravenous  Airway Management Planned: Oral ETT  Additional Equipment:   Intra-op Plan:   Post-operative Plan: Extubation in OR  Informed Consent:   Dental advisory given  Plan Discussed with: CRNA and Surgeon  Anesthesia Plan Comments:         Anesthesia Quick Evaluation

## 2012-11-29 NOTE — Anesthesia Postprocedure Evaluation (Signed)
  Anesthesia Post-op Note  Patient: [name]  Procedure(s) Performed: Procedure(s): LAPAROSCOPIC CHOLECYSTECTOMY WITH INTRAOPERATIVE CHOLANGIOGRAM (N/A)  Patient Location: PACU  Anesthesia Type:General  Level of Consciousness: awake  Airway and Oxygen Therapy: Patient Spontanous Breathing  Post-op Pain: mild  Post-op Assessment: Post-op Vital signs reviewed  Post-op Vital Signs: stable  Complications: No apparent anesthesia complications

## 2012-11-29 NOTE — Progress Notes (Signed)
Pt reports nausea subsided. Tolerating soda and crackers.

## 2012-12-01 ENCOUNTER — Encounter (HOSPITAL_COMMUNITY): Payer: Self-pay | Admitting: General Surgery

## 2012-12-03 ENCOUNTER — Telehealth (INDEPENDENT_AMBULATORY_CARE_PROVIDER_SITE_OTHER): Payer: Self-pay | Admitting: *Deleted

## 2012-12-03 NOTE — Telephone Encounter (Signed)
Patient called to ask about dressing.  Patient instructed she can remove the tegaderm and wash area with soap and water, pat dry.  Instructed patient to just let the steri-strips come off on their own but she could cut the corners as they start rolling up.

## 2012-12-13 ENCOUNTER — Ambulatory Visit
Admission: RE | Admit: 2012-12-13 | Discharge: 2012-12-13 | Disposition: A | Discharge status: Home / Self Care | Payer: BC Managed Care – PPO | Source: Ambulatory Visit

## 2012-12-13 DIAGNOSIS — Z1231 Encounter for screening mammogram for malignant neoplasm of breast: Secondary | ICD-10-CM

## 2012-12-16 ENCOUNTER — Ambulatory Visit (INDEPENDENT_AMBULATORY_CARE_PROVIDER_SITE_OTHER): Payer: BC Managed Care – PPO | Admitting: General Surgery

## 2012-12-16 ENCOUNTER — Encounter (INDEPENDENT_AMBULATORY_CARE_PROVIDER_SITE_OTHER): Payer: Self-pay | Admitting: General Surgery

## 2012-12-16 VITALS — BP 121/68 | HR 71 | Temp 97.7°F | Resp 16 | Ht 67.0 in | Wt 167.6 lb

## 2012-12-16 DIAGNOSIS — Z09 Encounter for follow-up examination after completed treatment for conditions other than malignant neoplasm: Secondary | ICD-10-CM

## 2012-12-16 NOTE — Progress Notes (Signed)
Subjective:     Patient ID: [name], female   DOB: [birth year], 47 y.o.   MRN: [id number]  HPI 47 year old female comes in for followup after undergoing laparoscopic cholecystectomy with cholangiogram. The surgery was performed on May 5. She states that she has done quite well. She denies any fever, chills, nausea or vomiting. She reports a good appetite. She has had some alteration in her bowel habits. She is alternating between diarrhea and constipation. She denies any trouble with her incisions  Review of Systems     Objective:   Physical Exam BP 121/68  Pulse 71  Temp(Src) 97.7 F (36.5 C) (Temporal)  Resp 16  Ht 5\' 7"  (1.702 m)  Wt 167 lb 9.6 oz (76.023 kg)  BMI 26.24 kg/m2  Gen: alert, NAD, non-toxic appearing Pupils: equal, no scleral icterus Pulm: Lungs clear to auscultation, symmetric chest rise CV: regular rate and rhythm Abd: soft, nontender, nondistended. Well-healed trocar sites. No cellulitis. No incisional hernia Skin: no rash, no jaundice    Assessment:     Status post laparoscopic cholecystectomy     Plan:     We discussed her pathology report which showed mild chronic cholecystitis and cholelithiasis. She was given a copy of her pathology report. I explained that her bowel habits may be altered for short time. We discussed the importance of drinking 6-8 glasses of water a day as well as slowly adopting a high fiber diet. followup as needed or call with any additional questions or concerns  Mary Sella. Andrey Campanile, MD, FACS General, Bariatric, & Minimally Invasive Surgery [facility], [place]

## 2012-12-16 NOTE — Patient Instructions (Signed)
Can resume full activities 

## 2013-02-21 ENCOUNTER — Ambulatory Visit: Payer: BC Managed Care – PPO | Admitting: Family

## 2013-10-19 ENCOUNTER — Telehealth: Payer: Self-pay | Admitting: Family

## 2013-10-19 MED ORDER — CETIRIZINE-PSEUDOEPHEDRINE ER 5-120 MG PO TB12: 1.0000 | ORAL_TABLET | Freq: Two times a day (BID) | ORAL | Status: AC

## 2013-10-19 NOTE — Telephone Encounter (Signed)
Notified pt. 

## 2013-10-19 NOTE — Telephone Encounter (Signed)
rx sent

## 2013-10-19 NOTE — Telephone Encounter (Signed)
Pt called back stating she usually takes zyrtec once a day every day and had pretty good symptom control.  States that with the recent weather change (spring) symptoms has drastically increased and it wasn't working as well. Her pharmacist had recommended she try sudafed. Pt states she took it for 7 days and noticed great improvement with symptoms. Pharmacist thought zyrtec D might help since the sudafed helped and pt is wanting to get rx of this. Her current symptoms include: sneezing, runny nose, eyes and face itch, throat feels scratchy. Please advise.

## 2013-10-19 NOTE — Telephone Encounter (Signed)
Patient states that zyrtec is not working for her anymore. The pharmacist told her to take sudafed(which she says is working great) but she doesn't want to take that long term. She has taken the sudafed for three days. The pharmacist told her to call our office to see if [place] would switch her from zyrtec to zyrtec-D

## 2013-10-19 NOTE — Telephone Encounter (Signed)
Left message for pt to return my call. Need to obtain symptoms that pt is treating with zyrtec.

## 2015-01-13 IMAGING — US US ABDOMEN COMPLETE
1 series · 14 of 25 positions shown · non-contrast
Comparison: None.

CLINICAL DATA: Abdominal pain.

COMPLETE ABDOMINAL ULTRASOUND

[Series 1: us abdomen complete · 0.28mm/px · 14 of 132 slices shown]
[im 1/132]
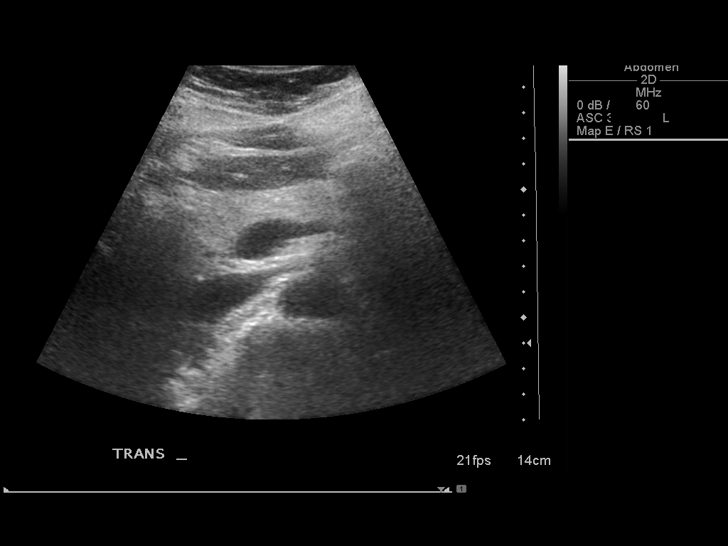
[im 11/132]
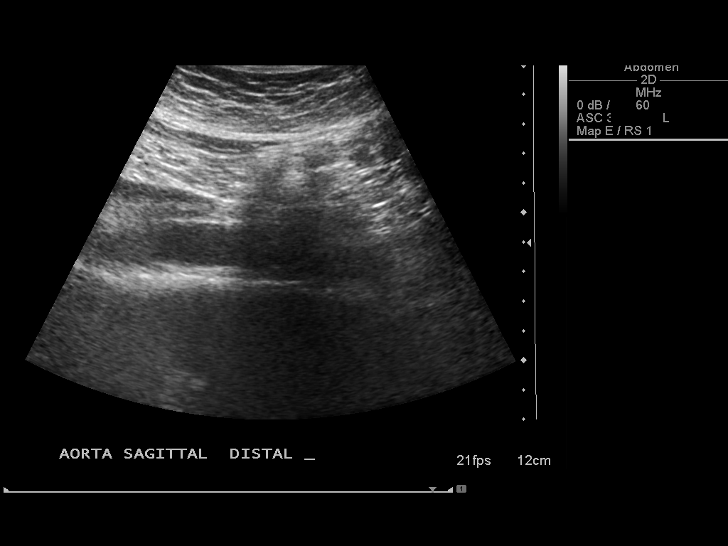
[im 22/132]
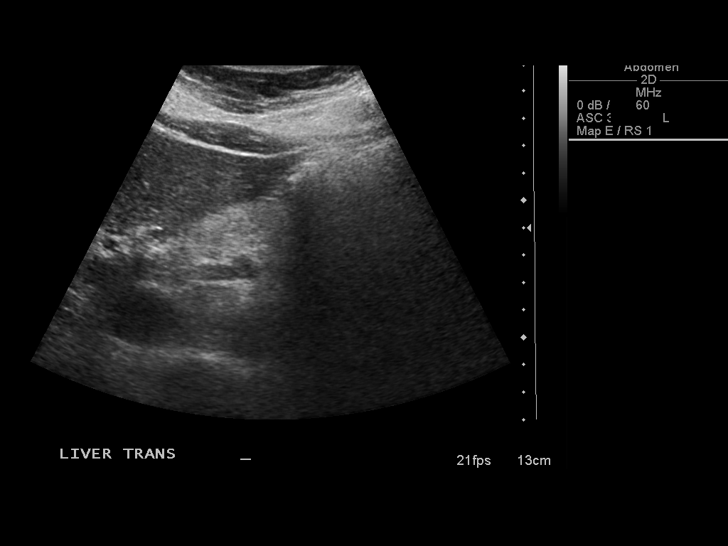
[im 33/132]
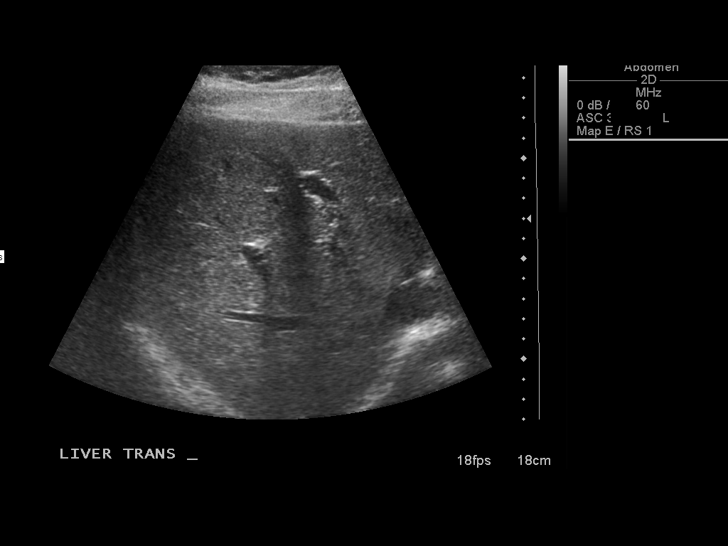
[im 44/132]
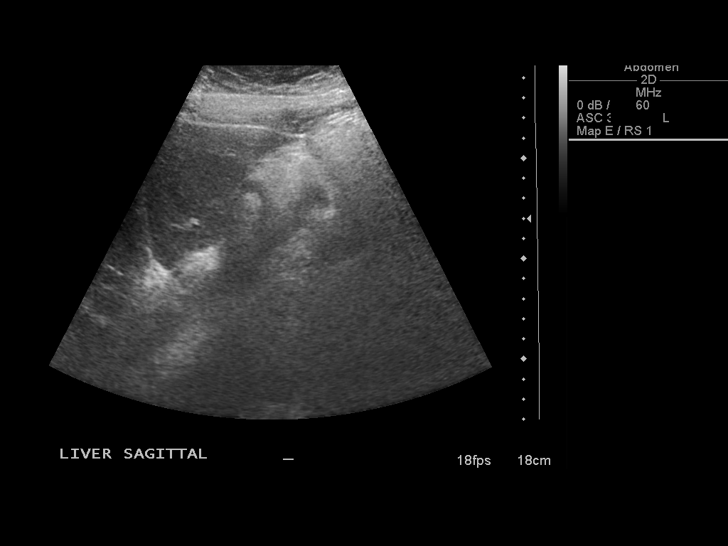
[im 50/132]
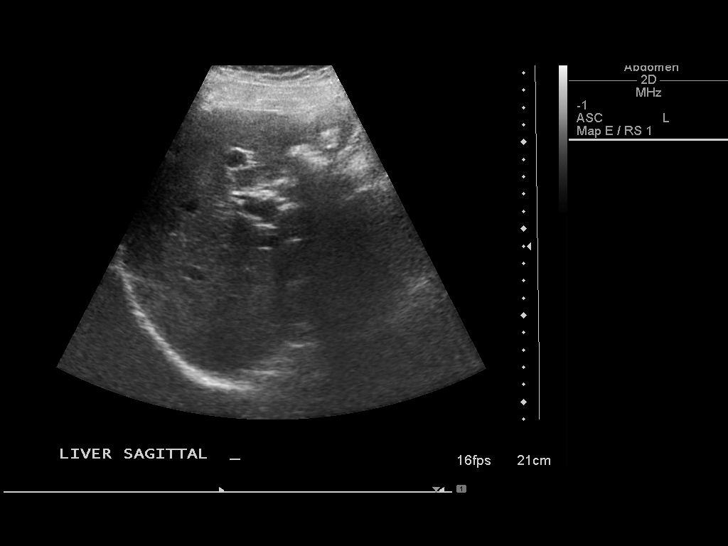
[im 61/132]
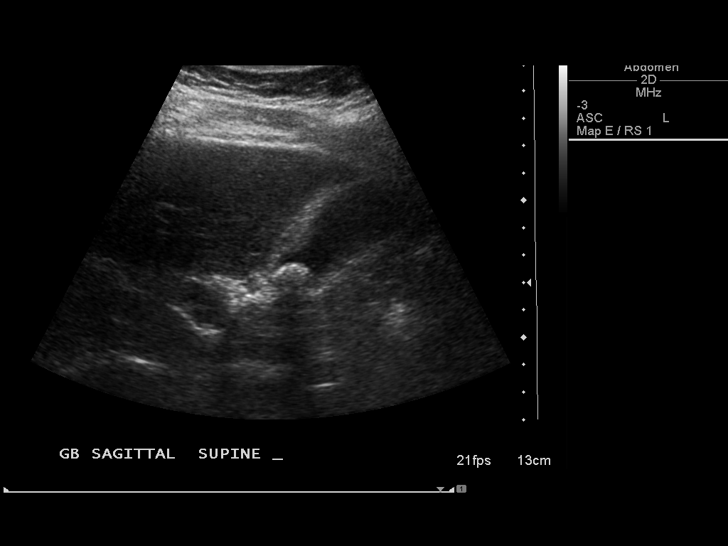
[im 71/132]
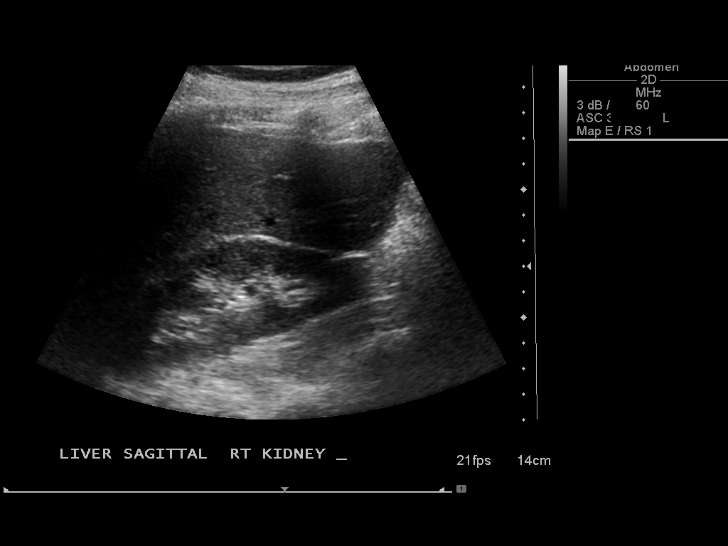
[im 82/132]
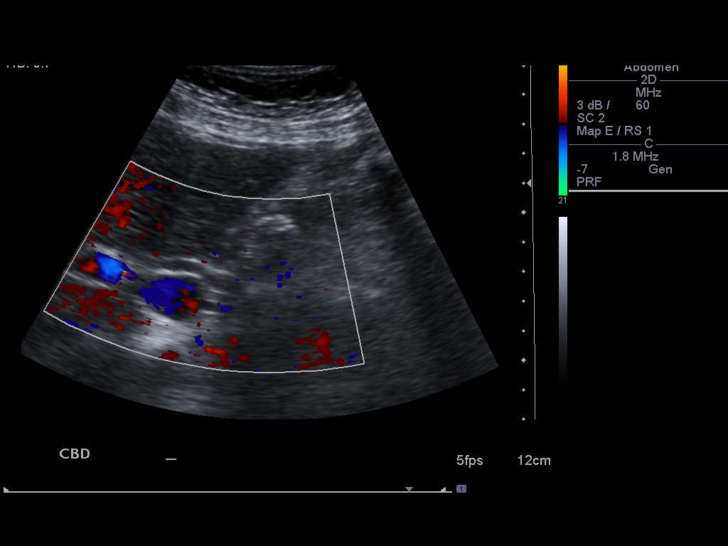
[im 88/132]
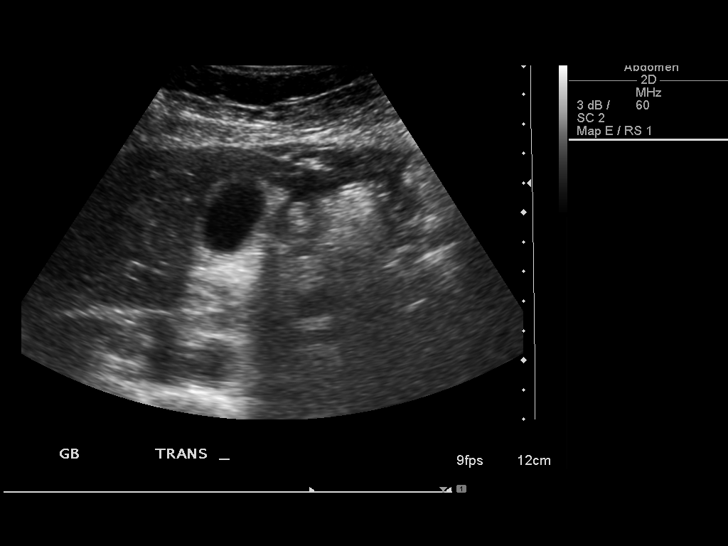
[im 99/132]
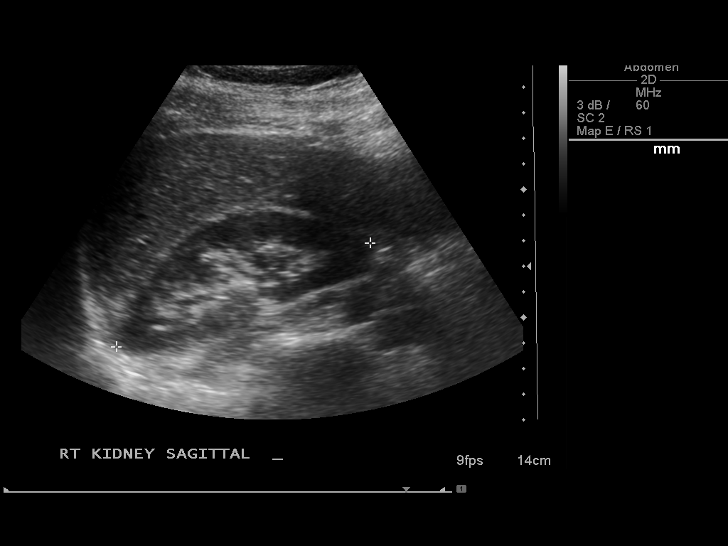
[im 110/132]
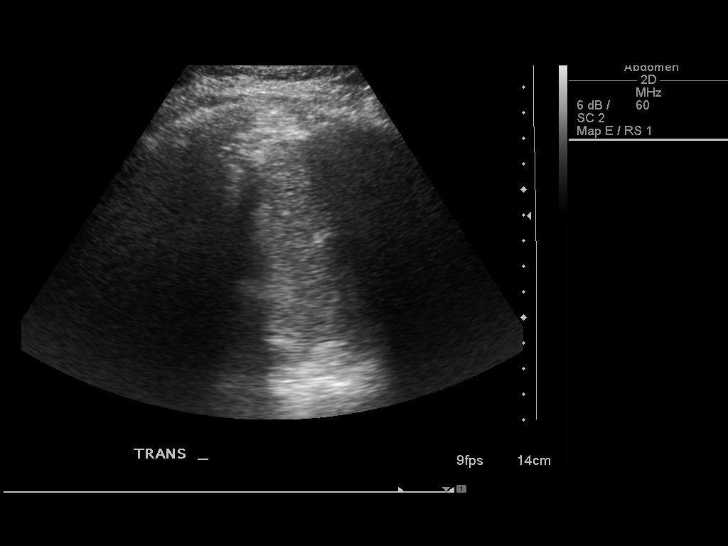
[im 121/132]
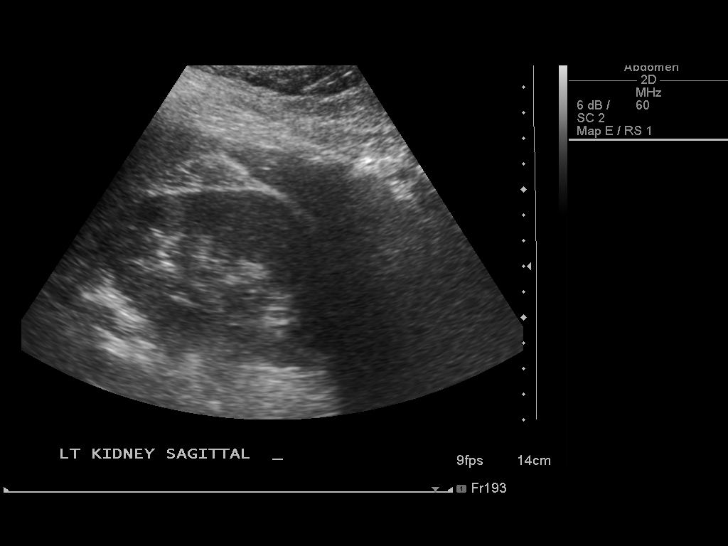
[im 132/132]
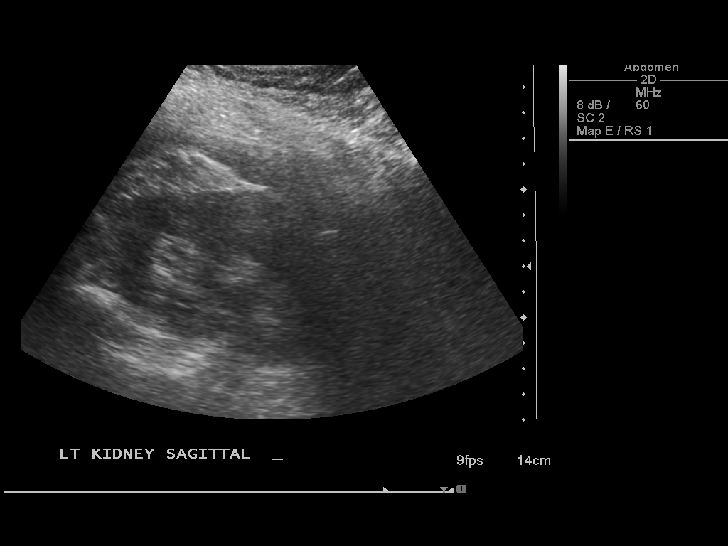

[14 of 25 positions shown; findings below may reference images not displayed]

FINDINGS: Gallbladder:  A 1.4 cm mobile stone is identified the gallbladder.
There is no gallbladder wall thickening or pericholecystic fluid.
Sonographer reports negative Murphy's sign.

Common bile duct:  Measures 0.6 cm.

Liver:  No focal lesion identified.  Within normal limits in
parenchymal echogenicity.

IVC:  Appears normal.

Pancreas:  No focal abnormality seen.

Spleen:  Measures 6.8 cm and appears normal.

Right Kidney:  Measures 10.7 cm and appears normal.

Left Kidney:  Measures 11.1 cm and appears normal.

Abdominal aorta:  No aneurysm identified.
IMPRESSION: A single 1.4 cm mobile gallstone is identified without evidence of
cholecystitis.

## 2015-03-01 ENCOUNTER — Emergency Department (HOSPITAL_COMMUNITY)
Admission: EM | Admit: 2015-03-01 | Discharge: 2015-03-01 | Disposition: A | Discharge status: Home / Self Care | Payer: BLUE CROSS/BLUE SHIELD | Attending: Emergency Medicine | Admitting: Emergency Medicine

## 2015-03-01 ENCOUNTER — Encounter (HOSPITAL_COMMUNITY): Payer: Self-pay

## 2015-03-01 DIAGNOSIS — Z87442 Personal history of urinary calculi: Secondary | ICD-10-CM | POA: Insufficient documentation

## 2015-03-01 DIAGNOSIS — R51 Headache: Secondary | ICD-10-CM | POA: Diagnosis present

## 2015-03-01 DIAGNOSIS — Z8709 Personal history of other diseases of the respiratory system: Secondary | ICD-10-CM | POA: Diagnosis not present

## 2015-03-01 DIAGNOSIS — R5383 Other fatigue: Secondary | ICD-10-CM | POA: Insufficient documentation

## 2015-03-01 DIAGNOSIS — Z8632 Personal history of gestational diabetes: Secondary | ICD-10-CM | POA: Insufficient documentation

## 2015-03-01 DIAGNOSIS — J069 Acute upper respiratory infection, unspecified: Secondary | ICD-10-CM | POA: Insufficient documentation

## 2015-03-01 DIAGNOSIS — F410 Panic disorder [episodic paroxysmal anxiety] without agoraphobia: Secondary | ICD-10-CM | POA: Insufficient documentation

## 2015-03-01 HISTORY — DX: Other seasonal allergic rhinitis: J30.2

## 2015-03-01 MED ORDER — BUTALBITAL-APAP-CAFFEINE 50-325-40 MG PO TABS
1.0000 | ORAL_TABLET | Freq: Once | ORAL | Status: AC
  Administered 2015-03-01: 1 via ORAL
  Filled 2015-03-01: qty 1

## 2015-03-01 NOTE — ED Notes (Signed)
Pt c/o headache/sinus pressure and low back pain x 1 week.  Pain score 5/10.  Sts emesis x 1 yesterday.  Denies injury, numbness, and tingling.  Pt reports being seen by GYN x 3 days ago and prescribed tramadol.  Pt thinks tramadol made her vomit.  Hx of allergies and taking an allergy medication everyday.

## 2015-03-01 NOTE — ED Provider Notes (Signed)
CSN: [id number]     Arrival date & time 03/01/15  0903 History   First MD Initiated Contact with Patient 03/01/15 [phone]     Chief Complaint  Patient presents with  . Headache  . Back Pain     (Consider location/radiation/quality/duration/timing/severity/associated sxs/prior Treatment) Patient is a 49 y.o. female presenting with general illness. The history is provided by the patient.  Illness Severity:  Moderate Onset quality:  Gradual Duration:  1 week Timing:  Constant Progression:  Worsening Chronicity:  New Associated symptoms: congestion, cough, fatigue, headaches and myalgias   Associated symptoms: no chest pain, no fever, no nausea, no rhinorrhea, no shortness of breath, no vomiting and no wheezing     Past Medical History  Diagnosis Date  . Hx gestational diabetes   . Borderline diabetes 09/19/2011  . History of kidney stones   . Heart palpitations     pt has panic attacks started 15 years ago; pt said the drs arent concerned with the palpitations  . Anxiety   . Panic attacks   . Seasonal allergies    Past Surgical History  Procedure Laterality Date  . Other surgical history  11/2004  . Lithotripsy    . Tubal ligation  1992    reversal in 2007  . Cholecystectomy N/A 11/29/2012    Procedure: LAPAROSCOPIC CHOLECYSTECTOMY WITH INTRAOPERATIVE CHOLANGIOGRAM;  Surgeon: Gayland Curry, MD;  Location: [facility] OR;  Service: General;  Laterality: N/A;   Family History  Problem Relation Age of Onset  . Adopted: Yes   History  Substance Use Topics  . Smoking status: Former Smoker -- 0.50 packs/day for 10 years  . Smokeless tobacco: Never Used  . Alcohol Use: Yes     Comment: occassional   OB History    No data available     Review of Systems  Constitutional: Positive for fatigue. Negative for fever and chills.  HENT: Positive for congestion. Negative for rhinorrhea.   Eyes: Negative for redness and visual disturbance.  Respiratory: Positive for cough. Negative for  shortness of breath and wheezing.   Cardiovascular: Negative for chest pain and palpitations.  Gastrointestinal: Negative for nausea and vomiting.  Genitourinary: Negative for dysuria and urgency.  Musculoskeletal: Positive for myalgias. Negative for arthralgias.  Skin: Negative for pallor and wound.  Neurological: Positive for headaches. Negative for dizziness.      Allergies  Review of patient's allergies indicates no known allergies.  Home Medications   Prior to Admission medications   Medication Sig Start Date End Date Taking? Authorizing Provider  cetirizine-pseudoephedrine (ZYRTEC-D) 5-120 MG per tablet Take 1 tablet by mouth 2 (two) times daily. Patient taking differently: Take 1 tablet by mouth daily.  10/19/13  Yes Debbrah Alar, NP  traMADol (ULTRAM) 50 MG tablet Take 50 mg by mouth 3 (three) times daily as needed. 02/26/15  Yes Historical Provider, MD   BP 139/76 mmHg  Pulse 96  Temp(Src) 98.5 F (36.9 C) (Oral)  Resp 20  SpO2 98%  LMP 02/15/2015 Physical Exam  Constitutional: She is oriented to person, place, and time. She appears well-developed and well-nourished. No distress.  HENT:  Head: Normocephalic and atraumatic.  TMs normal, swollen turbinates  Eyes: EOM are normal. Pupils are equal, round, and reactive to light.  Neck: Normal range of motion. Neck supple.  Cardiovascular: Normal rate and regular rhythm.  Exam reveals no gallop and no friction rub.   No murmur heard. Pulmonary/Chest: Effort normal. She has no wheezes. She has no rales.  Abdominal: Soft. She exhibits no distension. There is no tenderness. There is no rebound and no guarding.  Musculoskeletal: She exhibits no edema or tenderness.  No noted low back tenderness in the area patient says is tender.  Neurological: She is alert and oriented to person, place, and time.  Skin: Skin is warm and dry. She is not diaphoretic.  Psychiatric: She has a normal mood and affect. Her behavior is normal.     ED Course  Procedures (including critical care time) Labs Review Labs Reviewed - No data to display  Imaging Review No results found.   EKG Interpretation None      MDM   Final diagnoses:  URI (upper respiratory infection)   49 yo F with a chief complaint of myalgias transient vertigo mild sinus tenderness. This been going on for the past week. She denies fevers or chills denies nausea or vomiting. Patient was seen by her OB who axis her PCP yesterday and diagnosed with a possible ruptured ovarian cyst. Patient says she's been having vague lower back pain as well as lower abdominal pain. Had a UA there that was negative without blood. Patient had one episode of vertigo that was relieved with meclizine. Patient with no indications for antibiotics for sinusitis. Patient is well-appearing and nontoxic benign abdominal exam. Doubt kidney stone or other serious illness.     11:05 AM:  I have discussed the diagnosis/risks/treatment options with the patient and family and believe the pt to be eligible for discharge home to follow-up with PCP. We also discussed returning to the ED immediately if new or worsening sx occur. We discussed the sx which are most concerning (e.g., sudden worsening pain, weakness) that necessitate immediate return. Medications administered to the patient during their visit and any new prescriptions provided to the patient are listed below.  Medications given during this visit Medications  butalbital-acetaminophen-caffeine (FIORICET, ESGIC) 50-325-40 MG per tablet 1 tablet (1 tablet Oral Given 03/01/15 1010)    Discharge Medication List as of 03/01/2015 10:03 AM       The patient appears reasonably screen and/or stabilized for discharge and I doubt any other medical condition or other [facility] requiring further screening, evaluation, or treatment in the ED at this time prior to discharge.    Deno Etienne, DO 03/01/15 1106

## 2015-03-01 NOTE — Discharge Instructions (Signed)
Take 4 over the counter ibuprofen tablets 3 times a day or 2 over-the-counter naproxen tablets twice a day for pain.  Viral Infections A virus is a type of germ. Viruses can cause:  Minor sore throats.  Aches and pains.  Headaches.  Runny nose.  Rashes.  Watery eyes.  Tiredness.  Coughs.  Loss of appetite.  Feeling sick to your stomach (nausea).  Throwing up (vomiting).  Watery poop (diarrhea). HOME CARE   Only take medicines as told by your doctor.  Drink enough water and fluids to keep your pee (urine) clear or pale yellow. Sports drinks are a good choice.  Get plenty of rest and eat healthy. Soups and broths with crackers or rice are fine. GET HELP RIGHT AWAY IF:   You have a very bad headache.  You have shortness of breath.  You have chest pain or neck pain.  You have an unusual rash.  You cannot stop throwing up.  You have watery poop that does not stop.  You cannot keep fluids down.  You or your child has a temperature by mouth above 102 F (38.9 C), not controlled by medicine.  Your baby is older than 3 months with a rectal temperature of 102 F (38.9 C) or higher.  Your baby is 71 months old or younger with a rectal temperature of 100.4 F (38 C) or higher. MAKE SURE YOU:   Understand these instructions.  Will watch this condition.  Will get help right away if you are not doing well or get worse. Document Released: 06/26/2008 Document Revised: 10/06/2011 Document Reviewed: 11/19/2010 [facility] Patient Information 2015 [place], [place]. This information is not intended to replace advice given to you by your health care provider. Make sure you discuss any questions you have with your health care provider.

## 2016-03-02 LAB — HM MAMMOGRAPHY: HM MAMMO: NORMAL (ref 0–4)

## 2016-03-02 LAB — HM PAP SMEAR

## 2016-09-02 ENCOUNTER — Ambulatory Visit (INDEPENDENT_AMBULATORY_CARE_PROVIDER_SITE_OTHER): Payer: PRIVATE HEALTH INSURANCE | Admitting: Family Medicine

## 2016-09-02 ENCOUNTER — Encounter: Payer: Self-pay | Admitting: Family Medicine

## 2016-09-02 VITALS — BP 128/80 | HR 92 | Temp 98.6°F | Resp 16 | Ht 67.0 in | Wt 164.4 lb

## 2016-09-02 DIAGNOSIS — M799 Soft tissue disorder, unspecified: Secondary | ICD-10-CM

## 2016-09-02 DIAGNOSIS — R7303 Prediabetes: Secondary | ICD-10-CM | POA: Diagnosis not present

## 2016-09-02 DIAGNOSIS — R002 Palpitations: Secondary | ICD-10-CM | POA: Diagnosis not present

## 2016-09-02 DIAGNOSIS — M7989 Other specified soft tissue disorders: Secondary | ICD-10-CM | POA: Insufficient documentation

## 2016-09-02 LAB — BASIC METABOLIC PANEL
BUN: 10 mg/dL (ref 6–23)
CO2: 28 mEq/L (ref 19–32)
Calcium: 9.3 mg/dL (ref 8.4–10.5)
Chloride: 106 mEq/L (ref 96–112)
Creatinine, Ser: 0.59 mg/dL (ref 0.40–1.20)
GFR: 115.21 mL/min (ref >60.00)
GLUCOSE: 122 mg/dL — AB (ref 70–99)
POTASSIUM: 4.2 meq/L (ref 3.5–5.1)
SODIUM: 140 meq/L (ref 135–145)

## 2016-09-02 LAB — CBC WITH DIFFERENTIAL/PLATELET
BASOS PCT: 0.5 % (ref 0.0–3.0)
Basophils Absolute: 0 10*3/uL (ref 0.0–0.1)
EOS PCT: 1.6 % (ref 0.0–5.0)
Eosinophils Absolute: 0.1 10*3/uL (ref 0.0–0.7)
HCT: 33.4 % — ABNORMAL LOW (ref 36.0–46.0)
Hemoglobin: 10.5 g/dL — ABNORMAL LOW (ref 12.0–15.0)
LYMPHS ABS: 1.7 10*3/uL (ref 0.7–4.0)
Lymphocytes Relative: 29.8 % (ref 12.0–46.0)
MCHC: 31.6 g/dL (ref 30.0–36.0)
MCV: 76 fl — AB (ref 78.0–100.0)
MONOS PCT: 7.7 % (ref 3.0–12.0)
Monocytes Absolute: 0.4 10*3/uL (ref 0.1–1.0)
NEUTROS ABS: 3.4 10*3/uL (ref 1.4–7.7)
NEUTROS PCT: 60.4 % (ref 43.0–77.0)
PLATELETS: 275 10*3/uL (ref 150.0–400.0)
RBC: 4.39 Mil/uL (ref 3.87–5.11)
RDW: 18.3 % — AB (ref 11.5–15.5)
WBC: 5.7 10*3/uL (ref 4.0–10.5)

## 2016-09-02 LAB — HEPATIC FUNCTION PANEL
ALK PHOS: 59 U/L (ref 39–117)
ALT: 10 U/L (ref 0–35)
AST: 11 U/L (ref 0–37)
Albumin: 4.2 g/dL (ref 3.5–5.2)
BILIRUBIN DIRECT: 0.1 mg/dL (ref 0.0–0.3)
BILIRUBIN TOTAL: 0.7 mg/dL (ref 0.2–1.2)
TOTAL PROTEIN: 6.9 g/dL (ref 6.0–8.3)

## 2016-09-02 LAB — TSH: TSH: 1.38 u[IU]/mL (ref 0.35–4.50)

## 2016-09-02 LAB — LIPID PANEL
CHOLESTEROL: 176 mg/dL (ref 0–200)
HDL: 97.4 mg/dL (ref >39.00)
LDL CALC: 62 mg/dL (ref 0–99)
NonHDL: 78.65
Total CHOL/HDL Ratio: 2
Triglycerides: 83 mg/dL (ref 0.0–149.0)
VLDL: 16.6 mg/dL (ref 0.0–40.0)

## 2016-09-02 LAB — HEMOGLOBIN A1C: Hgb A1c MFr Bld: 6.2 % (ref 4.6–6.5)

## 2016-09-02 NOTE — Progress Notes (Signed)
Pre visit review using our clinic review tool, if applicable. No additional management support is needed unless otherwise documented below in the visit note. 

## 2016-09-02 NOTE — Progress Notes (Signed)
   Subjective:    Patient ID: [name], female    DOB: 1965/08/04, 51 y.o.   MRN: [id number]  HPI New to establish.  Previous PCP- Inda Castle  GYN- [facility] (UTD on pap, mammo)  Borderline diabetes- currently on Metformin 500mg  daily per GYN.   Pt had gestational DM w/ last pregnancy.  Since then, she was working on [employer] and regular exercise.  Last A1C 6 months ago was 'either 6.2 or 6.4'.  7 months ago she went [place] and recently re-introduced dairy.  Not exercising regularly.  R 4th finger nodule- on PIP joint, is enlarging.  Aches at times.  Anxiety- pt has hx of previous panic attack 'years ago'.  Will have intermittent palpitations.  Denies SOB.  Last occurred yesterday.  Happening at least once weekly.     Review of Systems For ROS see HPI     Objective:   Physical Exam  Constitutional: She is oriented to person, place, and time. She appears well-developed and well-nourished. No distress.  HENT:  Head: Normocephalic and atraumatic.  Eyes: Conjunctivae and EOM are normal. Pupils are equal, round, and reactive to light.  Neck: Normal range of motion. Neck supple. No thyromegaly present.  Cardiovascular: Normal rate, regular rhythm, normal heart sounds and intact distal pulses.   No murmur heard. Pulmonary/Chest: Effort normal and breath sounds normal. No respiratory distress.  Abdominal: Soft. She exhibits no distension. There is no tenderness.  Musculoskeletal: She exhibits deformity (R 4th PIP soft tissue mass). She exhibits no edema.  Lymphadenopathy:    She has no cervical adenopathy.  Neurological: She is alert and oriented to person, place, and time.  Skin: Skin is warm and dry.  Psychiatric: She has a normal mood and affect. Her behavior is normal.  Vitals reviewed.         Assessment & Plan:

## 2016-09-02 NOTE — Assessment & Plan Note (Signed)
New.  Pt feels this is anxiety and/or caffeine related but wants to make sure there is nothing else at play.  EKG today shows NSR but pt is asymptomatic. Check labs to r/o thyroid abnormality or anemia.  Encouraged her to keep a log of her sxs to determine if there's a temporal relationship w/ her caffeine intake/anxiety.  If yes, we can work on lifestyle modifications.  If no obvious relationship, may need holter monitor.  Pt expressed understanding and is in agreement w/ plan.

## 2016-09-02 NOTE — Patient Instructions (Signed)
Follow up in 1 month to recheck palpitations We'll notify you of your lab results and make any changes if needed Continue to work on healthy diet and regular exercise- you can do it!!! We'll call you with your Orthopedic appt for the nodule on your hand Call with any questions or concerns Welcome!  We're glad to have you!!!

## 2016-09-02 NOTE — Assessment & Plan Note (Signed)
New to provider, ongoing for pt.  She was started on Metformin by GYN.  A1C has never exceeded 6.5 so she has never been 'truly diabetic'.  Check labs.  Adjust tx prn.

## 2016-09-02 NOTE — Assessment & Plan Note (Signed)
New.  On dorsum of R 4th finger at PIP joint.  Enlarging per pt's report and uncomfortable.  Refer to hand specialist.  Pt expressed understanding and is in agreement w/ plan.

## 2016-09-03 ENCOUNTER — Encounter: Payer: Self-pay | Admitting: Family Medicine

## 2016-09-11 ENCOUNTER — Encounter: Payer: Self-pay | Admitting: Family Medicine

## 2016-09-19 ENCOUNTER — Encounter: Payer: Self-pay | Admitting: Family Medicine

## 2016-09-19 DIAGNOSIS — D229 Melanocytic nevi, unspecified: Secondary | ICD-10-CM

## 2016-09-19 DIAGNOSIS — Z1283 Encounter for screening for malignant neoplasm of skin: Secondary | ICD-10-CM

## 2016-09-29 ENCOUNTER — Encounter: Payer: Self-pay | Admitting: Family Medicine

## 2016-09-30 ENCOUNTER — Ambulatory Visit: Payer: PRIVATE HEALTH INSURANCE | Admitting: Family Medicine

## 2017-04-07 ENCOUNTER — Encounter: Payer: Self-pay | Admitting: Family

## 2017-04-07 ENCOUNTER — Ambulatory Visit (INDEPENDENT_AMBULATORY_CARE_PROVIDER_SITE_OTHER): Payer: BLUE CROSS/BLUE SHIELD | Admitting: Family

## 2017-04-07 VITALS — BP 135/76 | HR 92 | Temp 98.5°F | Resp 14 | Ht 67.0 in | Wt 169.0 lb

## 2017-04-07 DIAGNOSIS — H109 Unspecified conjunctivitis: Secondary | ICD-10-CM | POA: Diagnosis not present

## 2017-04-07 MED ORDER — NEOMYCIN-POLYMYXIN-HC 3.5-10000-1 OP SUSP: 2.0000 [drp] | Freq: Four times a day (QID) | OPHTHALMIC | 0 refills | Status: AC

## 2017-04-07 MED FILL — NEOMYCIN/POLY/HC EYE DROPS: 3.5-10000-1 | 10 days supply | Qty: 8 | Fill #0

## 2017-04-07 NOTE — Progress Notes (Signed)
   Subjective:    Patient ID: [name], female    DOB: 09-28-1965, 51 y.o.   MRN: [id number]  HPI   Ms. [name] is a 51 yr old female who presents today with c/o eye irritation. Reports that symptoms began 3 days ago.  Using OTC antihistamine drops without much improvement. Husband has pink eye.  She continues zyrtec D.  Denies vision changes.    Review of Systems    see HPI  Past Medical History:  Diagnosis Date  . Anxiety   . Borderline diabetes 09/19/2011  . Heart palpitations    pt has panic attacks started 15 years ago; pt said the drs arent concerned with the palpitations  . History of kidney stones   . Hx gestational diabetes   . Panic attacks   . Seasonal allergies      Social History   Social History  . Marital status: Married    Spouse name: N/A  . Number of children: N/A  . Years of education: N/A   Occupational History  . Not on file.   Social History Main Topics  . Smoking status: Former Smoker    Packs/day: 0.50    Years: 10.00    Quit date: 09/02/1996  . Smokeless tobacco: Never Used  . Alcohol use Yes     Comment: occassional  . Drug use: No  . Sexual activity: Yes    Birth control/ protection: Surgical   Other Topics Concern  . Not on file   Social History Narrative   Adopted          Past Surgical History:  Procedure Laterality Date  . CHOLECYSTECTOMY N/A 11/29/2012   Procedure: LAPAROSCOPIC CHOLECYSTECTOMY WITH INTRAOPERATIVE CHOLANGIOGRAM;  Surgeon: Gayland Curry, MD;  Location: [place];  Service: General;  Laterality: N/A;  . LITHOTRIPSY    . OTHER SURGICAL HISTORY  11/2004  . TUBAL LIGATION  1992   reversal in 2007    Family History  Problem Relation Age of Onset  . Adopted: Yes    No Known Allergies  Current Outpatient Prescriptions on File Prior to Visit  Medication Sig Dispense Refill  . cetirizine-pseudoephedrine (ZYRTEC-D) 5-120 MG per tablet Take 1 tablet by mouth 2 (two) times daily. (Patient taking differently:  Take 1 tablet by mouth daily. ) 60 tablet 2  . metFORMIN (GLUCOPHAGE) 500 MG tablet Take 500 mg by mouth daily.  1   No current facility-administered medications on file prior to visit.     BP 135/76 (BP Location: Right Arm, Cuff Size: Normal)   Pulse 92   Temp 98.5 F (36.9 C) (Oral)   Resp 14   Ht 5\' 7"  (1.702 m)   Wt 169 lb (76.7 kg)   LMP 04/07/2017   SpO2 100%   BMI 26.47 kg/m    Objective:   Physical Exam  Constitutional: She is oriented to person, place, and time. She appears well-developed and well-nourished. No distress.  HENT:  Head: Normocephalic.  Eyes:    Bilateral scleral injection Eyelids mildly edematous  Neurological: She is alert and oriented to person, place, and time.  Psychiatric: She has a normal mood and affect. Her behavior is normal. Judgment and thought content normal.          Assessment & Plan:  Bacterial conjunctivitis- will rx with cortisporin, she is advised to call new/worsening symptoms or if not improved in 2-3

## 2017-04-07 NOTE — Patient Instructions (Signed)
Please begin cortisporin drops. Call if new/worsening symptoms or if not improved in 2-3 days.   Bacterial Conjunctivitis Bacterial conjunctivitis is an infection of the clear membrane that covers the white part of your eye and the inner surface of your eyelid (conjunctiva). When the blood vessels in your conjunctiva become inflamed, your eye becomes red or pink, and it will probably feel itchy. Bacterial conjunctivitis spreads very easily from person to person (is contagious). It also spreads easily from one eye to the other eye. What are the causes? This condition is caused by several common bacteria. You may get the infection if you come into close contact with another person who is infected. You may also come into contact with items that are contaminated with the bacteria, such as a face towel, contact lens solution, or eye makeup. What increases the risk? This condition is more likely to develop in people who:  Are exposed to other people who have the infection.  Wear contact lenses.  Have a sinus infection.  Have had a recent eye injury or surgery.  Have a weak body defense system (immune system).  Have a medical condition that causes dry eyes.  What are the signs or symptoms? Symptoms of this condition include:  Eye redness.  Tearing or watery eyes.  Itchy eyes.  Burning feeling in your eyes.  Thick, yellowish discharge from an eye. This may turn into a crust on the eyelid overnight and cause your eyelids to stick together.  Swollen eyelids.  Blurred vision.  How is this diagnosed? Your health care provider can diagnose this condition based on your symptoms and medical history. Your health care provider may also take a sample of discharge from your eye to find the cause of your infection. This is rarely done. How is this treated? Treatment for this condition includes:  Antibiotic eye drops or ointment to clear the infection more quickly and prevent the spread of  infection to others.  Oral antibiotic medicines to treat infections that do not respond to drops or ointments, or last longer than 10 days.  Cool, wet cloths (cool compresses) placed on the eyes.  Artificial tears applied 2-6 times a day.  Follow these instructions at home: Medicines  Take or apply your antibiotic medicine as told by your health care provider. Do not stop taking or applying the antibiotic even if you start to feel better.  Take or apply over-the-counter and prescription medicines only as told by your health care provider.  Be very careful to avoid touching the edge of your eyelid with the eye drop bottle or the ointment tube when you apply medicines to the affected eye. This will keep you from spreading the infection to your other eye or to other people. Managing discomfort  Gently wipe away any drainage from your eye with a warm, wet washcloth or a cotton ball.  Apply a cool, clean washcloth to your eye for 10-20 minutes, 3-4 times a day. General instructions  Do not wear contact lenses until the inflammation is gone and your health care provider says it is safe to wear them again. Ask your health care provider how to sterilize or replace your contact lenses before you use them again. Wear glasses until you can resume wearing contacts.  Avoid wearing eye makeup until the inflammation is gone. Throw away any old eye cosmetics that may be contaminated.  Change or wash your pillowcase every day.  Do not share towels or washcloths. This may spread the infection.  Wash your hands often with soap and water. Use paper towels to dry your hands.  Avoid touching or rubbing your eyes.  Do not drive or use heavy machinery if your vision is blurred. Contact a health care provider if:  You have a fever.  Your symptoms do not get better after 10 days. Get help right away if:  You have a fever and your symptoms suddenly get worse.  You have severe pain when you move  your eye.  You have facial pain, redness, or swelling.  You have sudden loss of vision. This information is not intended to replace advice given to you by your health care provider. Make sure you discuss any questions you have with your health care provider. Document Released: 07/14/2005 Document Revised: 11/22/2015 Document Reviewed: 04/26/2015 Elsevier Interactive Patient Education  2017 [employer].

## 2017-04-09 ENCOUNTER — Telehealth: Payer: Self-pay | Admitting: Family Medicine

## 2017-04-09 ENCOUNTER — Encounter: Payer: Self-pay | Admitting: Family

## 2017-04-09 NOTE — Telephone Encounter (Signed)
Patient Name: [name]  DOB: May 22, 1966    Initial Comment caller was prescribed antibiotic eye drops yesterday . Earlier she accidently put over the counter anti- histamine drops in her eye . can she still countinue using antibiotic drops as well    Nurse Assessment  Nurse: Raphael Gibney, RN, Vanita Ingles Date/Time (Eastern Time): 04/09/2017 12:22:50 PM  Confirm and document reason for call. If symptomatic, describe symptoms. ---Caller states she was prescribed antibiotic eye drops for pink eye. She used the OTC antihistamine eye drops about 20 min ago by accident. Wants to know if it is ok to do the antibiotic eye drops now or should she wait.  Does the patient have any new or worsening symptoms? ---No  Please document clinical information provided and list any resource used. ---advised caller as per nursing knowledge to wait about an hr before using the antibiotic eye drops. Verbalized understanding.     Guidelines    Guideline Title Affirmed Question Affirmed Notes       Final Disposition User   Clinical Call Shelton, RN, Vanita Ingles

## 2017-04-13 ENCOUNTER — Telehealth: Payer: Self-pay | Admitting: Family Medicine

## 2017-04-13 ENCOUNTER — Ambulatory Visit (INDEPENDENT_AMBULATORY_CARE_PROVIDER_SITE_OTHER): Payer: BLUE CROSS/BLUE SHIELD | Admitting: Family

## 2017-04-13 ENCOUNTER — Encounter: Payer: Self-pay | Admitting: Family

## 2017-04-13 VITALS — BP 140/76 | HR 83 | Temp 98.6°F | Resp 18 | Ht 67.0 in | Wt 169.0 lb

## 2017-04-13 DIAGNOSIS — H1033 Unspecified acute conjunctivitis, bilateral: Secondary | ICD-10-CM

## 2017-04-13 DIAGNOSIS — B0052 Herpesviral keratitis: Secondary | ICD-10-CM | POA: Diagnosis not present

## 2017-04-13 DIAGNOSIS — B301 Conjunctivitis due to adenovirus: Secondary | ICD-10-CM | POA: Diagnosis not present

## 2017-04-13 NOTE — Telephone Encounter (Signed)
Patient Name: [name] DOB: June 22, 1966 Initial Comment Caller States her vision is blurry, she does have pink eye but it seems to be getting worse. Nurse Assessment Nurse: Ronnald Ramp, RN, Miranda Date/Time (Eastern Time): 04/13/2017 8:36:45 AM Confirm and document reason for call. If symptomatic, describe symptoms. ---Caller states she was diagnosed with pink eye and prescribed antibiotic eye drops about 6 days ago. Symptoms were improving but this morning, her vision is blurry. Does the patient have any new or worsening symptoms? ---Yes Will a triage be completed? ---Yes Related visit to physician within the last 2 weeks? ---Yes Does the PT have any chronic conditions? (i.e. diabetes, asthma, etc.) ---Yes List chronic conditions. ---Borderline diabetes, Allergies Is the patient pregnant or possibly pregnant? (Ask all females between the ages of 48-55) ---No Is this a behavioral health or substance abuse call? ---No Guidelines Guideline Title Affirmed Question Affirmed Notes Eye - Pus or Discharge Blurred vision Final Disposition User See Physician within 4 Hours (or PCP triage) Ronnald Ramp, RN, Miranda Comments Appt scheduled for 1045 today with PCP Referrals REFERRED TO PCP OFFICE Disagree/Comply: Comply

## 2017-04-13 NOTE — Progress Notes (Signed)
Subjective:    Patient ID: [name], female    DOB: [birth year], 51 y.o.   MRN: [id number]  HPI  [name] is a 51 yr old female who presents today for follow up of her conjunctivitis. We saw her on 9/14 and she was prescribed cortisporin eye drops. She reports no significant improvement in her symptoms.  Reports that eyes feel "gritty."  Reports that it feels like she has "sleep in my eyes." Reports that her vision seems more blurred today.    Review of Systems See HPI  Past Medical History:  Diagnosis Date  . Anxiety   . Borderline diabetes 09/19/2011  . Heart palpitations    pt has panic attacks started 15 years ago; pt said the drs arent concerned with the palpitations  . History of kidney stones   . Hx gestational diabetes   . Panic attacks   . Seasonal allergies      Social History   Social History  . Marital status: Married    Spouse name: N/A  . Number of children: N/A  . Years of education: N/A   Occupational History  . Not on file.   Social History Main Topics  . Smoking status: Former Smoker    Packs/day: 0.50    Years: 10.00    Quit date: 09/02/1996  . Smokeless tobacco: Never Used  . Alcohol use Yes     Comment: occassional  . Drug use: No  . Sexual activity: Yes    Birth control/ protection: Surgical   Other Topics Concern  . Not on file   Social History Narrative   Adopted          Past Surgical History:  Procedure Laterality Date  . CHOLECYSTECTOMY N/A 11/29/2012   Procedure: LAPAROSCOPIC CHOLECYSTECTOMY WITH INTRAOPERATIVE CHOLANGIOGRAM;  Surgeon: Gayland Curry, MD;  Location: [place];  Service: General;  Laterality: N/A;  . LITHOTRIPSY    . OTHER SURGICAL HISTORY  11/2004  . TUBAL LIGATION  1992   reversal in 2007    Family History  Problem Relation Age of Onset  . Adopted: Yes    No Known Allergies  Current Outpatient Prescriptions on File Prior to Visit  Medication Sig Dispense Refill  . cetirizine-pseudoephedrine  (ZYRTEC-D) 5-120 MG per tablet Take 1 tablet by mouth 2 (two) times daily. (Patient taking differently: Take 1 tablet by mouth daily. ) 60 tablet 2  . metFORMIN (GLUCOPHAGE) 500 MG tablet Take 500 mg by mouth daily.  1  . neomycin-polymyxin-hydrocortisone (CORTISPORIN) 3.5-10000-1 ophthalmic suspension Place 2 drops into both eyes 4 (four) times daily. 7.5 mL 0   No current facility-administered medications on file prior to visit.     BP 140/76 (BP Location: Right Arm, Cuff Size: Normal)   Pulse 83   Temp 98.6 F (37 C) (Oral)   Resp 18   Ht 5\' 7"  (1.702 m)   Wt 169 lb (76.7 kg)   LMP 04/07/2017   SpO2 100%   BMI 26.47 kg/m       Objective:   Physical Exam  Constitutional: She is oriented to person, place, and time. She appears well-developed and well-nourished. No distress.  HENT:  Head: Normocephalic and atraumatic.  Eyes: Pupils are equal, round, and reactive to light. Right conjunctiva is injected. Left conjunctiva is injected.  Neurological: She is alert and oriented to person, place, and time.  Skin: Skin is warm and dry.  Psychiatric: She has a normal mood and affect. Her behavior  is normal. Judgment and thought content normal.          Assessment & Plan:  conjuctivitis-  Essentially unchanged. Will refer to opthalmology for further evaluation.  An appointment was made with [facility] today. Pt is given appointment details.

## 2017-04-13 NOTE — Patient Instructions (Addendum)
Please go to [facility] at 1:45 PM today for a 2PM appointment.  [address], [place], [place] [address]

## 2017-04-14 DIAGNOSIS — B0052 Herpesviral keratitis: Secondary | ICD-10-CM | POA: Diagnosis not present

## 2017-04-14 DIAGNOSIS — B301 Conjunctivitis due to adenovirus: Secondary | ICD-10-CM | POA: Diagnosis not present

## 2017-04-15 DIAGNOSIS — B0052 Herpesviral keratitis: Secondary | ICD-10-CM | POA: Diagnosis not present

## 2017-04-15 DIAGNOSIS — B301 Conjunctivitis due to adenovirus: Secondary | ICD-10-CM | POA: Diagnosis not present

## 2017-04-17 DIAGNOSIS — B0052 Herpesviral keratitis: Secondary | ICD-10-CM | POA: Diagnosis not present

## 2017-04-17 DIAGNOSIS — B301 Conjunctivitis due to adenovirus: Secondary | ICD-10-CM | POA: Diagnosis not present

## 2017-04-20 DIAGNOSIS — B0052 Herpesviral keratitis: Secondary | ICD-10-CM | POA: Diagnosis not present

## 2017-04-20 DIAGNOSIS — B301 Conjunctivitis due to adenovirus: Secondary | ICD-10-CM | POA: Diagnosis not present

## 2017-05-26 DIAGNOSIS — H52223 Regular astigmatism, bilateral: Secondary | ICD-10-CM | POA: Diagnosis not present

## 2017-05-26 DIAGNOSIS — H524 Presbyopia: Secondary | ICD-10-CM | POA: Diagnosis not present

## 2017-05-26 DIAGNOSIS — H5203 Hypermetropia, bilateral: Secondary | ICD-10-CM | POA: Diagnosis not present

## 2017-05-26 LAB — HM DIABETES EYE EXAM

## 2017-05-28 ENCOUNTER — Encounter: Payer: Self-pay | Admitting: General Practice

## 2017-06-01 DIAGNOSIS — H31091 Other chorioretinal scars, right eye: Secondary | ICD-10-CM | POA: Diagnosis not present

## 2017-06-01 DIAGNOSIS — H35412 Lattice degeneration of retina, left eye: Secondary | ICD-10-CM | POA: Diagnosis not present

## 2017-06-01 DIAGNOSIS — H35463 Secondary vitreoretinal degeneration, bilateral: Secondary | ICD-10-CM | POA: Diagnosis not present

## 2017-06-01 DIAGNOSIS — H33322 Round hole, left eye: Secondary | ICD-10-CM | POA: Diagnosis not present

## 2017-06-16 ENCOUNTER — Encounter: Payer: Self-pay | Admitting: General Practice

## 2017-06-22 DIAGNOSIS — H33322 Round hole, left eye: Secondary | ICD-10-CM | POA: Diagnosis not present

## 2017-07-14 ENCOUNTER — Encounter: Payer: Self-pay | Admitting: Family Medicine

## 2017-07-15 MED ORDER — METFORMIN HCL 500 MG PO TABS
500.0000 mg | ORAL_TABLET | Freq: Every day | ORAL | 3 refills | Status: DC
Start: 2017-07-15 — End: 2017-11-22

## 2017-07-15 NOTE — Telephone Encounter (Signed)
Looks like pt has been scheduled  

## 2017-08-03 ENCOUNTER — Encounter: Payer: Self-pay | Admitting: Family Medicine

## 2017-08-03 ENCOUNTER — Ambulatory Visit (INDEPENDENT_AMBULATORY_CARE_PROVIDER_SITE_OTHER): Payer: BLUE CROSS/BLUE SHIELD | Admitting: Family Medicine

## 2017-08-03 ENCOUNTER — Other Ambulatory Visit: Payer: Self-pay

## 2017-08-03 ENCOUNTER — Encounter: Payer: Self-pay | Admitting: General Practice

## 2017-08-03 VITALS — BP 138/78 | HR 81 | Temp 98.1°F | Resp 16 | Ht 67.0 in | Wt 168.2 lb

## 2017-08-03 DIAGNOSIS — Z6826 Body mass index (BMI) 26.0-26.9, adult: Secondary | ICD-10-CM | POA: Diagnosis not present

## 2017-08-03 DIAGNOSIS — Z1231 Encounter for screening mammogram for malignant neoplasm of breast: Secondary | ICD-10-CM | POA: Diagnosis not present

## 2017-08-03 DIAGNOSIS — R7303 Prediabetes: Secondary | ICD-10-CM

## 2017-08-03 DIAGNOSIS — Z01419 Encounter for gynecological examination (general) (routine) without abnormal findings: Secondary | ICD-10-CM | POA: Diagnosis not present

## 2017-08-03 DIAGNOSIS — Z1151 Encounter for screening for human papillomavirus (HPV): Secondary | ICD-10-CM | POA: Diagnosis not present

## 2017-08-03 LAB — BASIC METABOLIC PANEL
BUN: 15 mg/dL (ref 6–23)
CO2: 28 meq/L (ref 19–32)
CREATININE: 0.58 mg/dL (ref 0.40–1.20)
Calcium: 9.2 mg/dL (ref 8.4–10.5)
Chloride: 103 mEq/L (ref 96–112)
GFR: 117.06 mL/min (ref >60.00)
Glucose, Bld: 108 mg/dL — ABNORMAL HIGH (ref 70–99)
Potassium: 3.6 mEq/L (ref 3.5–5.1)
SODIUM: 139 meq/L (ref 135–145)

## 2017-08-03 LAB — CBC WITH DIFFERENTIAL/PLATELET
BASOS PCT: 0.4 % (ref 0.0–3.0)
Basophils Absolute: 0 10*3/uL (ref 0.0–0.1)
EOS ABS: 0.1 10*3/uL (ref 0.0–0.7)
Eosinophils Relative: 1.3 % (ref 0.0–5.0)
HCT: 38.8 % (ref 36.0–46.0)
Hemoglobin: 12.4 g/dL (ref 12.0–15.0)
Lymphocytes Relative: 31.4 % (ref 12.0–46.0)
Lymphs Abs: 1.6 10*3/uL (ref 0.7–4.0)
MCHC: 32.1 g/dL (ref 30.0–36.0)
MCV: 85 fl (ref 78.0–100.0)
MONO ABS: 0.3 10*3/uL (ref 0.1–1.0)
Monocytes Relative: 5.3 % (ref 3.0–12.0)
NEUTROS ABS: 3.2 10*3/uL (ref 1.4–7.7)
Neutrophils Relative %: 61.6 % (ref 43.0–77.0)
PLATELETS: 221 10*3/uL (ref 150.0–400.0)
RBC: 4.56 Mil/uL (ref 3.87–5.11)
RDW: 15.2 % (ref 11.5–15.5)
WBC: 5.2 10*3/uL (ref 4.0–10.5)

## 2017-08-03 LAB — LIPID PANEL
CHOLESTEROL: 190 mg/dL (ref 0–200)
HDL: 83 mg/dL (ref >39.00)
LDL Cholesterol: 91 mg/dL (ref 0–99)
NONHDL: 107.46
Total CHOL/HDL Ratio: 2
Triglycerides: 83 mg/dL (ref 0.0–149.0)
VLDL: 16.6 mg/dL (ref 0.0–40.0)

## 2017-08-03 LAB — HEPATIC FUNCTION PANEL
ALK PHOS: 69 U/L (ref 39–117)
ALT: 14 U/L (ref 0–35)
AST: 13 U/L (ref 0–37)
Albumin: 4.4 g/dL (ref 3.5–5.2)
BILIRUBIN DIRECT: 0.2 mg/dL (ref 0.0–0.3)
TOTAL PROTEIN: 7 g/dL (ref 6.0–8.3)
Total Bilirubin: 0.8 mg/dL (ref 0.2–1.2)

## 2017-08-03 LAB — HEMOGLOBIN A1C: HEMOGLOBIN A1C: 6.4 % (ref 4.6–6.5)

## 2017-08-03 LAB — HM MAMMOGRAPHY

## 2017-08-03 LAB — TSH: TSH: 1.02 u[IU]/mL (ref 0.35–4.50)

## 2017-08-03 NOTE — Patient Instructions (Signed)
Schedule your complete physical in 6 months We'll notify you of your lab results and make any changes if needed Try and make healthy food choices and get regular exercise- you can do it!! Call with any questions or concerns Happy New Year!!!

## 2017-08-03 NOTE — Assessment & Plan Note (Signed)
Chronic problem.  Pt is on Metformin once daily to prevent transition to diabetes.  UTD on eye exam.  Stressed need for healthy diet and regular exercise.  Will check labs to determine if tx plan needs to be adjusted.  Will follow.

## 2017-08-03 NOTE — Progress Notes (Signed)
   Subjective:    Patient ID: Duane Lope, female    DOB: 1966/03/24, 52 y.o.   MRN: [id number]  HPI Pre-diabetes- ongoing issue for pt.  She is on Metformin daily.  UTD on eye exam.  Not exercising- wants to 'work on that this year'  Denies symptomatic lows.  No CP, SOB, HAs, visual changes, abd pain, N/V, edema.  Flu shot today.  Review of Systems For ROS see HPI     Objective:   Physical Exam  Constitutional: She is oriented to person, place, and time. She appears well-developed and well-nourished. No distress.  HENT:  Head: Normocephalic and atraumatic.  Eyes: Conjunctivae and EOM are normal. Pupils are equal, round, and reactive to light.  Neck: Normal range of motion. Neck supple. No thyromegaly present.  Cardiovascular: Normal rate, regular rhythm, normal heart sounds and intact distal pulses.  No murmur heard. Pulmonary/Chest: Effort normal and breath sounds normal. No respiratory distress.  Abdominal: Soft. She exhibits no distension. There is no tenderness.  Musculoskeletal: She exhibits no edema.  Lymphadenopathy:    She has no cervical adenopathy.  Neurological: She is alert and oriented to person, place, and time.  Skin: Skin is warm and dry.  Psychiatric: She has a normal mood and affect. Her behavior is normal.  Vitals reviewed.         Assessment & Plan:

## 2017-08-18 ENCOUNTER — Encounter: Payer: Self-pay | Admitting: General Practice

## 2017-11-22 ENCOUNTER — Other Ambulatory Visit: Payer: Self-pay | Admitting: Family Medicine

## 2018-02-05 ENCOUNTER — Encounter: Payer: BLUE CROSS/BLUE SHIELD | Admitting: Family Medicine

## 2018-05-05 ENCOUNTER — Ambulatory Visit: Payer: BLUE CROSS/BLUE SHIELD | Admitting: Family Medicine

## 2018-05-05 ENCOUNTER — Ambulatory Visit: Payer: Self-pay | Admitting: Family Medicine

## 2018-05-05 DIAGNOSIS — R42 Dizziness and giddiness: Secondary | ICD-10-CM | POA: Diagnosis not present

## 2018-05-05 DIAGNOSIS — J069 Acute upper respiratory infection, unspecified: Secondary | ICD-10-CM | POA: Diagnosis not present

## 2018-05-05 NOTE — Telephone Encounter (Signed)
Incoming call from patient with complaint of dizziness. Patient states she is 3 days in with the symptoms.  States she did attend a wedding this past weekend.  Had about 3 drinks.   Woke up the next day.   Feeling sick on her stomach.  Room spinning.  Patient states she is feeling better, yet not completely herself.  Heart rate 113  Rate the severity :severe.   Onset was Sunday.  Aggravating factors quick movement, walking.  Ok when sitting down or  Laying down. Right side is sensitive. Patient feels she has a sinus infection. Patient states this has occurred in the past.  Just stayed home and rested taken  Zyrtec and Alka Seltzer. " Patient states she just wants the spinning to go"  Provided care advice, Patient voiced under standing.  Patient scheduled for appointment today 05/04/18 @ 4 pm With Dr.  Tabori.  Patient voices understanding.   Reason for Disposition . [1] MODERATE dizziness (e.g., interferes with normal activities) AND [2] has NOT been evaluated by physician for this  (Exception: dizziness caused by heat exposure, sudden standing, or poor fluid intake)  Answer Assessment - Initial Assessment Questions 1. DESCRIPTION: "Describe your dizziness."     *No Answer* 2. LIGHTHEADED: "Do you feel lightheaded?" (e.g., somewhat faint, woozy, weak upon standing)     *No Answer* 3. VERTIGO: "Do you feel like either you or the room is spinning or tilting?" (i.e. vertigo)     *No Answer* 4. SEVERITY: "How bad is it?"  "Do you feel like you are going to faint?" "Can you stand and walk?"   - MILD - walking normally   - MODERATE - interferes with normal activities (e.g., work, school)    - SEVERE - unable to stand, requires support to walk, feels like passing out now.      severe 5. ONSET:  "When did the dizziness begin?"     Sunday 6. AGGRAVATING FACTORS: "Does anything make it worse?" (e.g., standing, change in head position)     Quick movement, walking 7. HEART RATE: "Can you tell me your heart  rate?" "How many beats in 15 seconds?"  (Note: not all patients can do this)       11 3 8. CAUSE: "What do you think is causing the dizziness?"     sinus 9. RECURRENT SYMPTOM: "Have you had dizziness before?" If so, ask: "When was the last time?" "What happened that time?"     Yes, spans rest allergy 10. OTHER SYMPTOMS: "Do you have any other symptoms?" (e.g., fever, chest pain, vomiting, diarrhea, bleeding)       no 11. PREGNANCY: "Is there any chance you are pregnant?" "When was your last menstrual period?"       *No Answer*  Protocols used: DIZZINESS [name]

## 2018-05-05 NOTE — Telephone Encounter (Signed)
Called and LMOVM for pt to return call. Advised that I have many openings this afternoon if she would like to come in sooner.   [place] for [facility] to Discuss results / PCP recommendations / Schedule patient.

## 2018-05-19 ENCOUNTER — Encounter: Payer: Self-pay | Admitting: Family Medicine

## 2018-08-27 ENCOUNTER — Other Ambulatory Visit: Payer: Self-pay

## 2018-08-27 ENCOUNTER — Encounter: Payer: Self-pay | Admitting: Family Medicine

## 2018-08-27 ENCOUNTER — Ambulatory Visit (INDEPENDENT_AMBULATORY_CARE_PROVIDER_SITE_OTHER): Payer: BLUE CROSS/BLUE SHIELD | Admitting: Family Medicine

## 2018-08-27 VITALS — BP 132/86 | HR 89 | Temp 98.4°F | Resp 16 | Ht 67.0 in | Wt 175.0 lb

## 2018-08-27 DIAGNOSIS — E663 Overweight: Secondary | ICD-10-CM

## 2018-08-27 DIAGNOSIS — Z0000 Encounter for general adult medical examination without abnormal findings: Secondary | ICD-10-CM

## 2018-08-27 DIAGNOSIS — R7303 Prediabetes: Secondary | ICD-10-CM | POA: Diagnosis not present

## 2018-08-27 DIAGNOSIS — M25511 Pain in right shoulder: Secondary | ICD-10-CM

## 2018-08-27 DIAGNOSIS — E7439 Other disorders of intestinal carbohydrate absorption: Secondary | ICD-10-CM | POA: Diagnosis not present

## 2018-08-27 DIAGNOSIS — E785 Hyperlipidemia, unspecified: Secondary | ICD-10-CM | POA: Diagnosis not present

## 2018-08-27 DIAGNOSIS — R87610 Atypical squamous cells of undetermined significance on cytologic smear of cervix (ASC-US): Secondary | ICD-10-CM | POA: Diagnosis not present

## 2018-08-27 DIAGNOSIS — Z1211 Encounter for screening for malignant neoplasm of colon: Secondary | ICD-10-CM | POA: Diagnosis not present

## 2018-08-27 DIAGNOSIS — Z1231 Encounter for screening mammogram for malignant neoplasm of breast: Secondary | ICD-10-CM | POA: Diagnosis not present

## 2018-08-27 DIAGNOSIS — Z6827 Body mass index (BMI) 27.0-27.9, adult: Secondary | ICD-10-CM | POA: Diagnosis not present

## 2018-08-27 DIAGNOSIS — E039 Hypothyroidism, unspecified: Secondary | ICD-10-CM | POA: Diagnosis not present

## 2018-08-27 DIAGNOSIS — Z01419 Encounter for gynecological examination (general) (routine) without abnormal findings: Secondary | ICD-10-CM | POA: Diagnosis not present

## 2018-08-27 DIAGNOSIS — Z1151 Encounter for screening for human papillomavirus (HPV): Secondary | ICD-10-CM | POA: Diagnosis not present

## 2018-08-27 NOTE — Patient Instructions (Signed)
Follow up in 1 year or as needed We'll notify you of your lab results and make any changes if needed Continue to work on healthy diet and regular exercise- you look great! Complete the cologuard and return as directed Call with any questions or concerns Have a great weekend!

## 2018-08-27 NOTE — Assessment & Plan Note (Signed)
Pt's PE WNL w/ exception of being overweight.  UTD on GYN, immunizations.  Will do cologuard rather than colonoscopy.  Check labs.  Anticipatory guidance provided.

## 2018-08-27 NOTE — Assessment & Plan Note (Signed)
Ongoing issue for pt.  Check labs to risk stratify.  Stressed need for healthy diet and regular exercise.  Will follow.

## 2018-08-27 NOTE — Assessment & Plan Note (Signed)
Ongoing issue for pt.  On Metformin BID.  Stressed need for healthy diet and regular exercise.  Check labs.  Adjust meds prn

## 2018-08-27 NOTE — Progress Notes (Signed)
   Subjective:    Patient ID: [name], female    DOB: 1965-09-22, 53 y.o.   MRN: [id number]  HPI CPE- UTD on GYN, due for colonoscopy- prefers cologuard.  UTD on flu shot.  Pt is up 7 lbs since last visit.   Review of Systems Patient reports no vision/ hearing changes, adenopathy,fever, weight change,  persistant/recurrent hoarseness , swallowing issues, chest pain, palpitations, edema, persistant/recurrent cough, hemoptysis, dyspnea (rest/exertional/paroxysmal nocturnal), gastrointestinal bleeding (melena, rectal bleeding), abdominal pain, significant heartburn, bowel changes, GU symptoms (dysuria, hematuria, incontinence), Gyn symptoms (abnormal  bleeding, pain),  syncope, focal weakness, memory loss, numbness & tingling, skin/hair/nail changes, abnormal bruising or bleeding, anxiety, or depression.   + R shoulder pain    Objective:   Physical Exam General Appearance:    Alert, cooperative, no distress, appears stated age, overweight  Head:    Normocephalic, without obvious abnormality, atraumatic  Eyes:    PERRL, conjunctiva/corneas clear, EOM's intact, fundi    benign, both eyes  Ears:    Normal TM's and external ear canals, both ears  Nose:   Nares normal, septum midline, mucosa normal, no drainage    or sinus tenderness  Throat:   Lips, mucosa, and tongue normal; teeth and gums normal  Neck:   Supple, symmetrical, trachea midline, no adenopathy;    Thyroid: no enlargement/tenderness/nodules  Back:     Symmetric, no curvature, ROM normal, no CVA tenderness  Lungs:     Clear to auscultation bilaterally, respirations unlabored  Chest Wall:    No tenderness or deformity   Heart:    Regular rate and rhythm, S1 and S2 normal, no murmur, rub   or gallop  Breast Exam:    Deferred to GYN  Abdomen:     Soft, non-tender, bowel sounds active all four quadrants,    no masses, no organomegaly  Genitalia:    Deferred to GYN  Rectal:    Extremities:   Extremities normal, atraumatic,  no cyanosis or edema  Pulses:   2+ and symmetric all extremities  Skin:   Skin color, texture, turgor normal, no rashes or lesions  Lymph nodes:   Cervical, supraclavicular, and axillary nodes normal  Neurologic:   CNII-XII intact, normal strength, sensation and reflexes    throughout          Assessment & Plan:

## 2018-08-28 LAB — CBC WITH DIFFERENTIAL/PLATELET
ABSOLUTE MONOCYTES: 380 {cells}/uL (ref 200–950)
BASOS PCT: 0.3 %
Basophils Absolute: 23 cells/uL (ref 0–200)
Eosinophils Absolute: 61 cells/uL (ref 15–500)
Eosinophils Relative: 0.8 %
HCT: 40.2 % (ref 35.0–45.0)
Hemoglobin: 13.4 g/dL (ref 11.7–15.5)
LYMPHS ABS: 2120 {cells}/uL (ref 850–3900)
MCH: 27.5 pg (ref 27.0–33.0)
MCHC: 33.3 g/dL (ref 32.0–36.0)
MCV: 82.5 fL (ref 80.0–100.0)
MPV: 11.2 fL (ref 7.5–12.5)
Monocytes Relative: 5 %
Neutro Abs: 5016 cells/uL (ref 1500–7800)
Neutrophils Relative %: 66 %
PLATELETS: 269 10*3/uL (ref 140–400)
RBC: 4.87 10*6/uL (ref 3.80–5.10)
RDW: 14.4 % (ref 11.0–15.0)
TOTAL LYMPHOCYTE: 27.9 %
WBC: 7.6 10*3/uL (ref 3.8–10.8)

## 2018-08-28 LAB — BASIC METABOLIC PANEL
BUN: 9 mg/dL (ref 7–25)
CHLORIDE: 101 mmol/L (ref 98–110)
CO2: 31 mmol/L (ref 20–32)
Calcium: 10.3 mg/dL (ref 8.6–10.4)
Creat: 0.63 mg/dL (ref 0.50–1.05)
Glucose, Bld: 182 mg/dL — ABNORMAL HIGH (ref 65–99)
POTASSIUM: 4 mmol/L (ref 3.5–5.3)
Sodium: 139 mmol/L (ref 135–146)

## 2018-08-28 LAB — HEPATIC FUNCTION PANEL
AG Ratio: 1.6 (calc) (ref 1.0–2.5)
ALBUMIN MSPROF: 4.5 g/dL (ref 3.6–5.1)
ALT: 27 U/L (ref 6–29)
AST: 20 U/L (ref 10–35)
Alkaline phosphatase (APISO): 96 U/L (ref 33–130)
BILIRUBIN INDIRECT: 0.3 mg/dL (ref 0.2–1.2)
Bilirubin, Direct: 0.1 mg/dL (ref 0.0–0.2)
GLOBULIN: 2.8 g/dL (ref 1.9–3.7)
TOTAL PROTEIN: 7.3 g/dL (ref 6.1–8.1)
Total Bilirubin: 0.4 mg/dL (ref 0.2–1.2)

## 2018-08-28 LAB — LIPID PANEL
CHOL/HDL RATIO: 2.6 (calc) (ref <5.0)
CHOLESTEROL: 212 mg/dL — AB (ref <200)
HDL: 81 mg/dL (ref >50)
LDL CHOLESTEROL (CALC): 110 mg/dL — AB
Non-HDL Cholesterol (Calc): 131 mg/dL (calc) — ABNORMAL HIGH (ref <130)
Triglycerides: 107 mg/dL (ref <150)

## 2018-08-28 LAB — HEMOGLOBIN A1C
Hgb A1c MFr Bld: 6.8 % of total Hgb — ABNORMAL HIGH (ref <5.7)
Mean Plasma Glucose: 148 (calc)
eAG (mmol/L): 8.2 (calc)

## 2018-08-28 LAB — TSH: TSH: 0.93 m[IU]/L

## 2018-08-30 ENCOUNTER — Other Ambulatory Visit: Payer: Self-pay | Admitting: General Practice

## 2018-08-30 ENCOUNTER — Other Ambulatory Visit: Payer: Self-pay | Admitting: Family Medicine

## 2018-08-30 DIAGNOSIS — E7849 Other hyperlipidemia: Secondary | ICD-10-CM

## 2018-08-30 MED ORDER — METFORMIN HCL 500 MG PO TABS: 500.0000 mg | ORAL_TABLET | Freq: Every day | ORAL | 1 refills | Status: DC

## 2018-08-30 MED ORDER — ATORVASTATIN CALCIUM 10 MG PO TABS: 10.0000 mg | ORAL_TABLET | Freq: Every day | ORAL | 3 refills | Status: AC

## 2018-08-30 NOTE — Telephone Encounter (Signed)
Copied from [place] [id number]. Topic: Quick Communication - Rx Refill/Question >> Aug 30, 2018 10:26 AM Nils Flack wrote: Medication:  Metformin and atorvastatin  Has the patient contacted their pharmacy? Yes.   (Agent: If no, request that the patient contact the pharmacy for the refill.) (Agent: If yes, when and what did the pharmacy advise?)  Preferred Pharmacy (with phone number or street name): [facility] [address]  Pt got message about atorvastatin being sent in, has not been sent in yet.   Agent: Please be advised that RX refills may take up to 3 business days. We ask that you follow-up with your pharmacy.

## 2018-09-22 ENCOUNTER — Ambulatory Visit (INDEPENDENT_AMBULATORY_CARE_PROVIDER_SITE_OTHER): Payer: BLUE CROSS/BLUE SHIELD | Admitting: Orthopedic Surgery

## 2018-09-22 ENCOUNTER — Ambulatory Visit (INDEPENDENT_AMBULATORY_CARE_PROVIDER_SITE_OTHER): Payer: Self-pay

## 2018-09-22 ENCOUNTER — Encounter (INDEPENDENT_AMBULATORY_CARE_PROVIDER_SITE_OTHER): Payer: Self-pay | Admitting: Orthopedic Surgery

## 2018-09-22 DIAGNOSIS — Z3202 Encounter for pregnancy test, result negative: Secondary | ICD-10-CM | POA: Diagnosis not present

## 2018-09-22 DIAGNOSIS — R87612 Low grade squamous intraepithelial lesion on cytologic smear of cervix (LGSIL): Secondary | ICD-10-CM | POA: Diagnosis not present

## 2018-09-22 DIAGNOSIS — M79601 Pain in right arm: Secondary | ICD-10-CM

## 2018-09-22 DIAGNOSIS — N870 Mild cervical dysplasia: Secondary | ICD-10-CM | POA: Diagnosis not present

## 2018-09-24 ENCOUNTER — Encounter (INDEPENDENT_AMBULATORY_CARE_PROVIDER_SITE_OTHER): Payer: Self-pay | Admitting: Orthopedic Surgery

## 2018-09-24 NOTE — Progress Notes (Signed)
Office Visit Note   Patient: [name]           Date of Birth: [birth year]           MRN: [id number] Visit Date: 09/22/2018 Requested by: Midge Minium, MD [address] [place], [place] [address] PCP: Midge Minium, MD  Subjective: Chief Complaint  Patient presents with  . Right Shoulder - Pain  . Right Hand - Pain    HPI: [name] is a patient with right shoulder and right hand pain.  She describes "knots" on the dorsal aspect of PIP joints 3 and 4.  They seem to flare up at times.  Is gotten some better since she is changed her diet since being diagnosed with diabetes.  Family history noncontributory as she is adopted.  She is had improvement in her symptoms with the change in diet.  All of her symptoms are are on the right side involving the shoulder and hand.              ROS: All systems reviewed are negative as they relate to the chief complaint within the history of present illness.  Patient denies  fevers or chills.   Assessment & Plan: Visit Diagnoses:  1. Right arm pain     Plan: Impression is right sided joint pain in the shoulder and hand primarily.  Structurally and radiographically the joints are normal.  She does however have some nodularity on the dorsal aspect of the PIP joints 3 and 4.  This does not interfere with extension and flexion at that joint.  Does not look like typical rheumatoid nodules and there is no spurring on plain radiographs.  I think this is something to follow for now.  This could be some type of low-level autoimmune joint inflammation.  I am going to see her back as needed.  We could do further imaging at that time if indicated.  Follow-Up Instructions: Return if symptoms worsen or fail to improve.   Orders:  Orders Placed This Encounter  Procedures  . XR Hand Complete Right  . XR Shoulder Right   No orders of the defined types were placed in this encounter.     Procedures: No procedures performed   Clinical  Data: No additional findings.  Objective: Vital Signs: There were no vitals taken for this visit.  Physical Exam:   Constitutional: Patient appears well-developed HEENT:  Head: Normocephalic Eyes:EOM are normal Neck: Normal range of motion Cardiovascular: Normal rate Pulmonary/chest: Effort normal Neurologic: Patient is alert Skin: Skin is warm Psychiatric: Patient has normal mood and affect    Ortho Exam: Ortho exam demonstrates good cervical spine range of motion.  Patient really has no warmth to palpation of her wrist elbow or shoulder joints on either side.  Right shoulder demonstrates full active and passive range of motion with excellent rotator cuff strength isolated infraspinatus supraspinatus and subscap muscle testing.  No masses lymphadenopathy or skin changes noted in the shoulder girdle region.  Elbow hand wrist range of motion is full.  Patient has full composite finger flexion and extension.  Does have a little nodularity on the dorsal aspect of the PIP joint digits 3 and 4 but no real warmth or synovitis in any of these upper extremity joints.  Specialty Comments:  No specialty comments available.  Imaging: No results found.   PMFS History: Patient Active Problem List   Diagnosis Date Noted  . Overweight (BMI 25.0-29.9) 08/27/2018  . Palpitations  09/02/2016  . Soft tissue mass 09/02/2016  . Anxiety state, unspecified 11/22/2012  . Unspecified hypothyroidism 11/22/2012  . Borderline diabetes 09/19/2011  . General medical examination 09/17/2011  . Breast mass, left 08/27/2011  . History of abnormal Pap smear 08/27/2011   Past Medical History:  Diagnosis Date  . Anxiety   . Borderline diabetes 09/19/2011  . Heart palpitations    pt has panic attacks started 15 years ago; pt said the drs arent concerned with the palpitations  . History of kidney stones   . Hx gestational diabetes   . Panic attacks   . Seasonal allergies     Family History  Adopted:  Yes    Past Surgical History:  Procedure Laterality Date  . CHOLECYSTECTOMY N/A 11/29/2012   Procedure: LAPAROSCOPIC CHOLECYSTECTOMY WITH INTRAOPERATIVE CHOLANGIOGRAM;  Surgeon: Gayland Curry, MD;  Location: [facility];  Service: General;  Laterality: N/A;  . LITHOTRIPSY    . OTHER SURGICAL HISTORY  11/2004  . TUBAL LIGATION  1992   reversal in 2007   Social History   Occupational History  . Not on file  Tobacco Use  . Smoking status: Former Smoker    Packs/day: 0.50    Years: 10.00    Pack years: 5.00    Last attempt to quit: 09/02/1996    Years since quitting: 22.0  . Smokeless tobacco: Never Used  Substance and Sexual Activity  . Alcohol use: Yes    Comment: occassional  . Drug use: No  . Sexual activity: Yes    Birth control/protection: Surgical

## 2019-01-13 ENCOUNTER — Encounter: Payer: Self-pay | Admitting: Family Medicine

## 2019-03-16 ENCOUNTER — Other Ambulatory Visit: Payer: Self-pay | Admitting: Family Medicine

## 2019-06-20 ENCOUNTER — Encounter: Payer: Self-pay | Admitting: Family Medicine

## 2019-07-26 ENCOUNTER — Inpatient Hospital Stay: Admission: RE | Admit: 2019-07-26 | Payer: BLUE CROSS/BLUE SHIELD | Source: Ambulatory Visit

## 2019-07-26 ENCOUNTER — Telehealth: Payer: BC Managed Care – PPO | Admitting: Nurse Practitioner

## 2019-07-26 DIAGNOSIS — J329 Chronic sinusitis, unspecified: Secondary | ICD-10-CM

## 2019-07-26 DIAGNOSIS — B9789 Other viral agents as the cause of diseases classified elsewhere: Secondary | ICD-10-CM | POA: Diagnosis not present

## 2019-07-26 MED ORDER — FLUTICASONE PROPIONATE 50 MCG/ACT NA SUSP: 2.0000 | Freq: Every day | NASAL | 6 refills | Status: AC

## 2019-07-26 NOTE — Progress Notes (Signed)
We are sorry that you are not feeling well.  Here is how we plan to help!  Based on what you have shared with me it looks like you have sinusitis.  Sinusitis is inflammation and infection in the sinus cavities of the head.  Based on your presentation I believe you most likely have Acute Viral Sinusitis.This is an infection most likely caused by a virus. There is not specific treatment for viral sinusitis other than to help you with the symptoms until the infection runs its course.  You may use an oral decongestant such as Mucinex D or if you have glaucoma or high blood pressure use plain Mucinex. Saline nasal spray help and can safely be used as often as needed for congestion, I have prescribed: Fluticasone nasal spray two sprays in each nostril once a day   * if you are no better or develop a fver in nest 2 days then may want to consider covid testing.  Testing Information: The COVID-19 Community Testing sites will begin testing BY APPOINTMENT ONLY.  You can schedule online at [email or web address]  If you do not have access to a smart phone or computer you may call [phone] for an appointment.  Testing Locations: Appointment schedule is 8 am to 3:30 pm at all sites  [facility] indoors at [address], [place] [address] [facility]  indoors at [place]. [address], [place], [place] [address] [place] indoors at [address] [place] [address]  Additional testing sites in the Community:  . For [facility] Testing sites in [facility]  FaceUpdate.uy  . For Pop-up testing sites in [place]  BowlDirectory.co.uk  . For Testing sites with regular hours https://onsms.org/[place]/  . For [place] RenewablesAnalytics.si  . For [facility]  BasicJet.ca  . For [facility] testing in [place] and [employer] BasicJet.ca  . For [facility] testing in [facility]   https://lhi.care/covidtesting  For  more information about community testing call [phone]   Some authorities believe that zinc sprays or the use of Echinacea may shorten the course of your symptoms.  Sinus infections are not as easily transmitted as other respiratory infection, however we still recommend that you avoid close contact with loved ones, especially the very young and elderly.  Remember to wash your hands thoroughly throughout the day as this is the number one way to prevent the spread of infection!  Home Care:  Only take medications as instructed by your medical team.  Do not take these medications with alcohol.  A steam or ultrasonic humidifier can help congestion.  You can place a towel over your head and breathe in the steam from hot water coming from a faucet.  Avoid close contacts especially the very young and the elderly.  Cover your mouth when you cough or sneeze.  Always remember to wash your hands.  Get Help Right Away If:  You develop worsening fever or sinus pain.  You develop a severe head ache or visual changes.  Your symptoms persist after you have completed your treatment plan.  Make sure you  Understand these instructions.  Will watch your condition.  Will get help right away if you are not doing well or get worse.  Your e-visit answers were reviewed by a board certified advanced clinical practitioner to complete your personal care plan.  Depending on the condition, your plan could have included both over the counter or prescription medications.  If there is a  problem please reply  once you have received  a response from your provider.  Your safety is important to [place].  If you have drug allergies check your prescription carefully.    You can use MyChart to ask questions about today's visit, request a non-urgent call back, or ask for a work or school excuse for 24 hours related to this e-Visit. If it has been greater than 24 hours you will need to follow up with your provider, or enter a new e-Visit to address those concerns.  You will get an e-mail in the next two days asking about your experience.  I hope that your e-visit has been valuable and will speed your recovery. Thank you for using e-visits.   5-10 minutes spent reviewing and documenting in chart.

## 2019-07-27 ENCOUNTER — Ambulatory Visit: Payer: BC Managed Care – PPO | Attending: Internal Medicine

## 2019-07-27 DIAGNOSIS — Z20822 Contact with and (suspected) exposure to covid-19: Secondary | ICD-10-CM

## 2019-07-30 LAB — NOVEL CORONAVIRUS, NAA: SARS-CoV-2, NAA: NOT DETECTED

## 2019-10-18 ENCOUNTER — Other Ambulatory Visit: Payer: Self-pay | Admitting: Family Medicine

## 2019-11-07 ENCOUNTER — Other Ambulatory Visit: Payer: Self-pay | Admitting: Family Medicine

## 2021-01-23 ENCOUNTER — Encounter: Payer: Self-pay | Admitting: *Deleted

## 2022-11-17 ENCOUNTER — Other Ambulatory Visit (HOSPITAL_COMMUNITY): Payer: Self-pay

## 2022-11-17 ENCOUNTER — Other Ambulatory Visit (HOSPITAL_BASED_OUTPATIENT_CLINIC_OR_DEPARTMENT_OTHER): Payer: Self-pay

## 2022-11-17 MED ORDER — MOUNJARO 5 MG/0.5ML ~~LOC~~ SOAJ
5.0000 mg | SUBCUTANEOUS | 1 refills | Status: DC
  Filled 2022-11-17: qty 2, 28d supply, fill #0

## 2022-11-17 MED ORDER — MOUNJARO 2.5 MG/0.5ML ~~LOC~~ SOAJ
2.5000 mg | SUBCUTANEOUS | 0 refills | Status: DC
  Filled 2022-11-17 – 2022-11-18 (×2): qty 2, 28d supply, fill #0

## 2022-11-18 ENCOUNTER — Other Ambulatory Visit (HOSPITAL_BASED_OUTPATIENT_CLINIC_OR_DEPARTMENT_OTHER): Payer: Self-pay

## 2022-11-18 ENCOUNTER — Other Ambulatory Visit: Payer: Self-pay

## 2022-11-19 ENCOUNTER — Other Ambulatory Visit (HOSPITAL_BASED_OUTPATIENT_CLINIC_OR_DEPARTMENT_OTHER): Payer: Self-pay

## 2023-02-03 ENCOUNTER — Other Ambulatory Visit (HOSPITAL_BASED_OUTPATIENT_CLINIC_OR_DEPARTMENT_OTHER): Payer: Self-pay

## 2023-02-04 ENCOUNTER — Other Ambulatory Visit (HOSPITAL_BASED_OUTPATIENT_CLINIC_OR_DEPARTMENT_OTHER): Payer: Self-pay

## 2023-02-04 MED ORDER — MOUNJARO 7.5 MG/0.5ML ~~LOC~~ SOAJ
7.5000 mg | SUBCUTANEOUS | 1 refills | Status: DC
  Filled 2023-02-04 (×2): qty 2, 28d supply, fill #0

## 2023-02-04 MED ORDER — MOUNJARO 7.5 MG/0.5ML ~~LOC~~ SOAJ
7.5000 mg | SUBCUTANEOUS | 0 refills | Status: DC
  Filled 2023-02-04: qty 2, 28d supply, fill #0

## 2023-03-10 ENCOUNTER — Other Ambulatory Visit (HOSPITAL_BASED_OUTPATIENT_CLINIC_OR_DEPARTMENT_OTHER): Payer: Self-pay

## 2023-03-10 MED ORDER — MOUNJARO 7.5 MG/0.5ML ~~LOC~~ SOAJ
7.5000 mg | SUBCUTANEOUS | 0 refills | Status: DC
  Filled 2023-03-10: qty 2, 28d supply, fill #0

## 2023-03-12 ENCOUNTER — Other Ambulatory Visit (HOSPITAL_BASED_OUTPATIENT_CLINIC_OR_DEPARTMENT_OTHER): Payer: Self-pay

## 2023-03-12 MED ORDER — CLONAZEPAM 0.5 MG PO TABS
0.5000 mg | ORAL_TABLET | Freq: Every day | ORAL | 0 refills | Status: AC | PRN
  Filled 2023-03-12: qty 10, 10d supply, fill #0

## 2023-04-17 ENCOUNTER — Other Ambulatory Visit (HOSPITAL_BASED_OUTPATIENT_CLINIC_OR_DEPARTMENT_OTHER): Payer: Self-pay

## 2023-04-20 ENCOUNTER — Other Ambulatory Visit (HOSPITAL_BASED_OUTPATIENT_CLINIC_OR_DEPARTMENT_OTHER): Payer: Self-pay

## 2023-04-20 MED ORDER — MOUNJARO 7.5 MG/0.5ML ~~LOC~~ SOAJ
7.5000 mg | SUBCUTANEOUS | 0 refills | Status: DC
  Filled 2023-04-20: qty 2, 28d supply, fill #0

## 2023-04-21 ENCOUNTER — Other Ambulatory Visit (HOSPITAL_BASED_OUTPATIENT_CLINIC_OR_DEPARTMENT_OTHER): Payer: Self-pay

## 2023-04-21 MED ORDER — MOUNJARO 7.5 MG/0.5ML ~~LOC~~ SOAJ
7.5000 mg | SUBCUTANEOUS | 2 refills | Status: DC
  Filled 2023-04-21 – 2023-06-15 (×2): qty 2, 28d supply, fill #0
  Filled 2023-07-15: qty 2, 28d supply, fill #1
  Filled 2023-08-12 – 2023-08-28 (×2): qty 2, 28d supply, fill #2

## 2023-05-17 ENCOUNTER — Other Ambulatory Visit (HOSPITAL_BASED_OUTPATIENT_CLINIC_OR_DEPARTMENT_OTHER): Payer: Self-pay

## 2023-05-18 ENCOUNTER — Other Ambulatory Visit (HOSPITAL_BASED_OUTPATIENT_CLINIC_OR_DEPARTMENT_OTHER): Payer: Self-pay

## 2023-05-18 MED ORDER — MOUNJARO 7.5 MG/0.5ML ~~LOC~~ SOAJ
7.5000 mg | SUBCUTANEOUS | 0 refills | Status: AC
  Filled 2023-05-18: qty 2, 28d supply, fill #0

## 2023-06-10 ENCOUNTER — Other Ambulatory Visit (HOSPITAL_BASED_OUTPATIENT_CLINIC_OR_DEPARTMENT_OTHER): Payer: Self-pay

## 2023-06-10 MED ORDER — CLONAZEPAM 0.5 MG PO TABS
0.5000 mg | ORAL_TABLET | Freq: Every day | ORAL | 0 refills | Status: AC | PRN
  Filled 2023-06-10: qty 10, 10d supply, fill #0

## 2023-06-15 ENCOUNTER — Other Ambulatory Visit (HOSPITAL_BASED_OUTPATIENT_CLINIC_OR_DEPARTMENT_OTHER): Payer: Self-pay

## 2023-06-16 ENCOUNTER — Other Ambulatory Visit (HOSPITAL_BASED_OUTPATIENT_CLINIC_OR_DEPARTMENT_OTHER): Payer: Self-pay

## 2023-06-23 ENCOUNTER — Other Ambulatory Visit (HOSPITAL_BASED_OUTPATIENT_CLINIC_OR_DEPARTMENT_OTHER): Payer: Self-pay

## 2023-07-15 ENCOUNTER — Other Ambulatory Visit (HOSPITAL_BASED_OUTPATIENT_CLINIC_OR_DEPARTMENT_OTHER): Payer: Self-pay

## 2023-07-18 ENCOUNTER — Other Ambulatory Visit (HOSPITAL_BASED_OUTPATIENT_CLINIC_OR_DEPARTMENT_OTHER): Payer: Self-pay

## 2023-08-12 ENCOUNTER — Other Ambulatory Visit (HOSPITAL_BASED_OUTPATIENT_CLINIC_OR_DEPARTMENT_OTHER): Payer: Self-pay

## 2023-08-24 ENCOUNTER — Other Ambulatory Visit (HOSPITAL_BASED_OUTPATIENT_CLINIC_OR_DEPARTMENT_OTHER): Payer: Self-pay

## 2023-08-28 ENCOUNTER — Other Ambulatory Visit (HOSPITAL_BASED_OUTPATIENT_CLINIC_OR_DEPARTMENT_OTHER): Payer: Self-pay

## 2023-09-02 ENCOUNTER — Other Ambulatory Visit (HOSPITAL_BASED_OUTPATIENT_CLINIC_OR_DEPARTMENT_OTHER): Payer: Self-pay

## 2023-09-02 MED ORDER — MOUNJARO 7.5 MG/0.5ML ~~LOC~~ SOAJ
7.5000 mg | SUBCUTANEOUS | 1 refills | Status: DC
  Filled 2023-09-02 – 2023-09-18 (×3): qty 6, 84d supply, fill #0
  Filled 2023-12-11: qty 6, 84d supply, fill #1

## 2023-09-15 ENCOUNTER — Other Ambulatory Visit (HOSPITAL_BASED_OUTPATIENT_CLINIC_OR_DEPARTMENT_OTHER): Payer: Self-pay

## 2023-09-18 ENCOUNTER — Other Ambulatory Visit: Payer: Self-pay

## 2023-09-18 ENCOUNTER — Other Ambulatory Visit (HOSPITAL_BASED_OUTPATIENT_CLINIC_OR_DEPARTMENT_OTHER): Payer: Self-pay

## 2023-09-23 ENCOUNTER — Other Ambulatory Visit (HOSPITAL_BASED_OUTPATIENT_CLINIC_OR_DEPARTMENT_OTHER): Payer: Self-pay

## 2023-09-23 MED ORDER — CLONAZEPAM 0.5 MG PO TABS
0.5000 mg | ORAL_TABLET | Freq: Every day | ORAL | 0 refills | Status: AC
  Filled 2023-09-23: qty 10, 10d supply, fill #0

## 2023-10-13 ENCOUNTER — Other Ambulatory Visit (HOSPITAL_BASED_OUTPATIENT_CLINIC_OR_DEPARTMENT_OTHER): Payer: Self-pay

## 2023-10-13 MED ORDER — MELOXICAM 15 MG PO TABS
15.0000 mg | ORAL_TABLET | Freq: Every day | ORAL | 1 refills | Status: DC
  Filled 2023-10-13: qty 30, 30d supply, fill #0

## 2023-12-11 ENCOUNTER — Other Ambulatory Visit (HOSPITAL_BASED_OUTPATIENT_CLINIC_OR_DEPARTMENT_OTHER): Payer: Self-pay

## 2023-12-18 ENCOUNTER — Other Ambulatory Visit (HOSPITAL_BASED_OUTPATIENT_CLINIC_OR_DEPARTMENT_OTHER): Payer: Self-pay

## 2024-02-02 ENCOUNTER — Other Ambulatory Visit (HOSPITAL_BASED_OUTPATIENT_CLINIC_OR_DEPARTMENT_OTHER): Payer: Self-pay

## 2024-02-02 MED ORDER — CLONAZEPAM 0.5 MG PO TABS
ORAL_TABLET | ORAL | 0 refills | Status: AC
  Filled 2024-02-02: qty 10, 10d supply, fill #0

## 2024-02-08 ENCOUNTER — Other Ambulatory Visit (HOSPITAL_BASED_OUTPATIENT_CLINIC_OR_DEPARTMENT_OTHER): Payer: Self-pay

## 2024-02-08 MED ORDER — METHOCARBAMOL 750 MG PO TABS
750.0000 mg | ORAL_TABLET | Freq: Three times a day (TID) | ORAL | 1 refills | Status: AC | PRN
  Filled 2024-02-08: qty 60, 20d supply, fill #0

## 2024-02-11 ENCOUNTER — Other Ambulatory Visit (HOSPITAL_BASED_OUTPATIENT_CLINIC_OR_DEPARTMENT_OTHER): Payer: Self-pay

## 2024-02-11 MED ORDER — PREDNISONE 10 MG (21) PO TBPK
ORAL_TABLET | ORAL | 0 refills | Status: AC
  Filled 2024-02-11: qty 21, 6d supply, fill #0

## 2024-02-23 ENCOUNTER — Other Ambulatory Visit (HOSPITAL_BASED_OUTPATIENT_CLINIC_OR_DEPARTMENT_OTHER): Payer: Self-pay

## 2024-02-23 MED ORDER — METHOCARBAMOL 750 MG PO TABS
750.0000 mg | ORAL_TABLET | Freq: Three times a day (TID) | ORAL | 1 refills | Status: DC
Start: 2024-02-22 — End: 2024-05-23
  Filled 2024-02-23: qty 60, 20d supply, fill #0

## 2024-03-01 ENCOUNTER — Other Ambulatory Visit (HOSPITAL_BASED_OUTPATIENT_CLINIC_OR_DEPARTMENT_OTHER): Payer: Self-pay

## 2024-03-01 MED ORDER — MOUNJARO 7.5 MG/0.5ML ~~LOC~~ SOAJ
7.5000 mg | SUBCUTANEOUS | 1 refills | Status: DC
  Filled 2024-03-04: qty 6, 84d supply, fill #0

## 2024-03-04 ENCOUNTER — Other Ambulatory Visit (HOSPITAL_BASED_OUTPATIENT_CLINIC_OR_DEPARTMENT_OTHER): Payer: Self-pay

## 2024-04-28 ENCOUNTER — Other Ambulatory Visit (HOSPITAL_BASED_OUTPATIENT_CLINIC_OR_DEPARTMENT_OTHER): Payer: Self-pay

## 2024-04-28 MED ORDER — CLONAZEPAM 0.5 MG PO TABS
0.5000 mg | ORAL_TABLET | Freq: Every day | ORAL | 0 refills | Status: AC | PRN
  Filled 2024-04-28: qty 10, 10d supply, fill #0

## 2024-05-07 ENCOUNTER — Other Ambulatory Visit: Payer: Self-pay

## 2024-05-07 ENCOUNTER — Emergency Department (HOSPITAL_BASED_OUTPATIENT_CLINIC_OR_DEPARTMENT_OTHER)
Admission: EM | Admit: 2024-05-07 | Discharge: 2024-05-08 | Disposition: A | Discharge status: Home / Self Care | Attending: Emergency Medicine | Admitting: Emergency Medicine

## 2024-05-07 ENCOUNTER — Encounter (HOSPITAL_BASED_OUTPATIENT_CLINIC_OR_DEPARTMENT_OTHER): Payer: Self-pay

## 2024-05-07 DIAGNOSIS — R002 Palpitations: Secondary | ICD-10-CM | POA: Insufficient documentation

## 2024-05-07 DIAGNOSIS — E119 Type 2 diabetes mellitus without complications: Secondary | ICD-10-CM | POA: Diagnosis not present

## 2024-05-07 DIAGNOSIS — Z7984 Long term (current) use of oral hypoglycemic drugs: Secondary | ICD-10-CM | POA: Diagnosis not present

## 2024-05-07 NOTE — ED Triage Notes (Signed)
 Pt POV from home c/o feeling her heart racing earlier this evening, advised that she checked her BP and CBG, both WDL. Pt stated she felt shaky. Pt tearful during triage, states unsure if anxiety or if something wrong with her.

## 2024-05-08 LAB — CBC WITH DIFFERENTIAL/PLATELET
Abs Immature Granulocytes: 0 K/uL (ref 0.00–0.07)
Basophils Absolute: 0 K/uL (ref 0.0–0.1)
Basophils Relative: 0 %
Eosinophils Absolute: 0.1 K/uL (ref 0.0–0.5)
Eosinophils Relative: 2 %
HCT: 38.5 % (ref 36.0–46.0)
Hemoglobin: 12.7 g/dL (ref 12.0–15.0)
Immature Granulocytes: 0 %
Lymphocytes Relative: 36 %
Lymphs Abs: 2 K/uL (ref 0.7–4.0)
MCH: 27.9 pg (ref 26.0–34.0)
MCHC: 33 g/dL (ref 30.0–36.0)
MCV: 84.6 fL (ref 80.0–100.0)
Monocytes Absolute: 0.4 K/uL (ref 0.1–1.0)
Monocytes Relative: 8 %
Neutro Abs: 3 K/uL (ref 1.7–7.7)
Neutrophils Relative %: 54 %
Platelets: 191 K/uL (ref 150–400)
RBC: 4.55 MIL/uL (ref 3.87–5.11)
RDW: 13.9 % (ref 11.5–15.5)
WBC: 5.5 K/uL (ref 4.0–10.5)
nRBC: 0 % (ref 0.0–0.2)

## 2024-05-08 LAB — BASIC METABOLIC PANEL WITH GFR
Anion gap: 11 (ref 5–15)
BUN: 12 mg/dL (ref 6–20)
CO2: 28 mmol/L (ref 22–32)
Calcium: 10.1 mg/dL (ref 8.9–10.3)
Chloride: 106 mmol/L (ref 98–111)
Creatinine, Ser: 0.8 mg/dL (ref 0.44–1.00)
GFR, Estimated: >60 mL/min (ref >60)
Glucose, Bld: 87 mg/dL (ref 70–99)
Potassium: 3.6 mmol/L (ref 3.5–5.1)
Sodium: 145 mmol/L (ref 135–145)

## 2024-05-08 LAB — TSH: TSH: 1.86 u[IU]/mL (ref 0.350–4.500)

## 2024-05-08 NOTE — Discharge Instructions (Signed)
 Be sure to check your blood sugar frequently to ensure that it is improving Be sure to eat full meals and stay hydrated and you can scale back your caffeine  You may need to wear a heart monitor outside the hospital, please see your primary doctor for this option

## 2024-05-08 NOTE — ED Provider Notes (Signed)
  EMERGENCY DEPARTMENT AT [facility] Provider Note   CSN: [id number] Arrival date & time: 05/07/24  2334     Patient presents with: Anxiety   [name] is a 58 y.o. female.   The history is provided by the patient.  Patient w/history of anxiety, previous history of palpitations presents with elevated heart rate and not feeling well.  Patient reports she went out to dinner and had a half a margarita.  Soon after she started feeling jittery and not herself.  She checked her heart rate at home and it was around 100.  No chest pain but she felt mildly short of breath.  No fevers or vomiting.  No focal weakness.  She checked her blood glucose at home multiple times and it has been as low as in the 70s even despite eating normally  No history of CAD.  No previous history of dysrhythmias.  No new medications.  She takes atorvastatin  and Mounjaro  weekly  No history of thyroid  dysfunction.  Denies any excessive caffeine  use No syncope is reported   Past Medical History:  Diagnosis Date   Anxiety    Borderline diabetes 09/19/2011   Heart palpitations    pt has panic attacks started 15 years ago; pt said the drs arent concerned with the palpitations   History of kidney stones    Hx gestational diabetes    Panic attacks    Seasonal allergies     Prior to Admission medications   Medication Sig Start Date End Date Taking? Authorizing Provider  atorvastatin  (LIPITOR) 10 MG tablet Take 1 tablet (10 mg total) by mouth daily. 08/30/18   Tabori, Katherine E, MD  cetirizine -pseudoephedrine  (ZYRTEC -D) 5-120 MG per tablet Take 1 tablet by mouth 2 (two) times daily. Patient taking differently: Take 1 tablet by mouth daily.  10/19/13   O'Sullivan, Amyra, NP  clonazePAM  (KLONOPIN ) 0.5 MG tablet Take 1 tablet (0.5 mg total) by mouth daily as needed for anxiety (when driving on highway) 1/84/75     clonazePAM  (KLONOPIN ) 0.5 MG tablet Take 1 tablet (0.5 mg total) by mouth daily  as needed  for anxiety (when driving on highway). 06/09/23     clonazePAM  (KLONOPIN ) 0.5 MG tablet Take 1 tablet (0.5 mg total) by mouth daily as needed for anxiety (when driving on highway). 09/23/23     clonazePAM  (KLONOPIN ) 0.5 MG tablet Take 1 tablet (0.5 mg total) by mouth daily as needed for anxiety (when driving on highway). 02/02/24     clonazePAM  (KLONOPIN ) 0.5 MG tablet Take 1 tablet (0.5 mg total) by mouth daily as needed for anxiety (when driving on highway). 04/28/24     fluticasone  (FLONASE ) 50 MCG/ACT nasal spray Place 2 sprays into both nostrils daily. 07/26/19   Gladis, Mary-Margaret, FNP  meloxicam  (MOBIC ) 15 MG tablet Take 1 tablet (15 mg total) by mouth daily. 10/13/23     metFORMIN  (GLUCOPHAGE ) 500 MG tablet TAKE 1 TABLET BY MOUTH EVERY DAY 03/16/19   Tabori, Katherine E, MD  methocarbamol  (ROBAXIN ) 750 MG tablet Take 1 tablet (750 mg total) by mouth 3 (three) times daily as needed for muscle spasms 02/08/24     methocarbamol  (ROBAXIN ) 750 MG tablet Take 1 tablet (750 mg total) by mouth 3 (three) times daily as needed for muscle spasms. 02/22/24     Multiple Vitamins-Minerals (ALIVE ONCE DAILY WOMENS 50+ PO) Take 1 tablet by mouth daily.    [provider]  predniSONE  (STERAPRED UNI-PAK 21 TAB) 10 MG (21)  TBPK tablet take as directed on package 02/11/24     tirzepatide  (MOUNJARO ) 7.5 MG/0.5ML Pen Inject 7.5 mg into the skin once a week. 05/18/23     tirzepatide  (MOUNJARO ) 7.5 MG/0.5ML Pen Inject 7.5 mg into the skin once a week. 03/01/24       Allergies: Patient has no known allergies.    Review of Systems  Constitutional:  Positive for fatigue. Negative for fever.  Cardiovascular:  Positive for palpitations. Negative for chest pain.  Neurological:  Negative for dizziness, syncope and weakness.    Updated Vital Signs BP 139/67   Pulse 84   Temp 98.5 F (36.9 C) (Oral)   Resp 15   Ht 1.702 m (5' 7)   Wt 69.4 kg   LMP  (LMP Unknown)   SpO2 98%   BMI 23.96 kg/m    Physical Exam CONSTITUTIONAL: Well developed/well nourished HEAD: Normocephalic/atraumatic EYES: EOMI/PERRL ENMT: Mucous membranes moist NECK: supple no meningeal signs SPINE/BACK:entire spine nontender CV: S1/S2 noted, no murmurs/rubs/gallops noted LUNGS: Lungs are clear to auscultation bilaterally, no apparent distress ABDOMEN: soft, nontender NEURO: Pt is awake/alert/appropriate, moves all extremitiesx4.  No facial droop.  No arm or leg drift EXTREMITIES: pulses normal/equalx4, full ROM, no calf tenderness SKIN: warm, color normal PSYCH: no abnormalities of mood noted, alert and oriented to situation  (all labs ordered are listed, but only abnormal results are displayed) Labs Reviewed  CBC WITH DIFFERENTIAL/PLATELET  TSH  BASIC METABOLIC PANEL WITH GFR    EKG: EKG Interpretation Date/Time:  Saturday May 07 2024 23:42:56 EDT Ventricular Rate:  97 PR Interval:  208 QRS Duration:  82 QT Interval:  362 QTC Calculation: 460 R Axis:   4  Text Interpretation: Sinus rhythm Prolonged PR interval Abnormal R-wave progression, early transition Consider anterior infarct Confirmed by Midge Golas ([id number]) on 05/08/2024 12:39:28 AM  Radiology: No results found.   Procedures   Medications Ordered in the ED - No data to display  Clinical Course as of 05/08/24 0209  [place] May 08, 2024  0208 Patient stable.  She reports feeling unwell after eating part of her dinner and having a half margarita.  She reports her blood sugars been lower than usual though it may be due to her Mounjaro .  No arrhythmias on telemetry.  Labs are overall reassuring.  Heart rate has remained essentially under 100 the whole time.  No hypoxia.  I have low suspicion for cardiac arrhythmia, low suspicion for PE  Patient safe for discharge home.  Will plan to have her follow-up with her PCP as she may need to have Holter monitoring.  She will also check her blood glucose frequently [DW]    Clinical  Course User Index [DW] Midge Golas, MD                                 Medical Decision Making Amount and/or Complexity of Data Reviewed Labs: ordered.   This patient presents to the ED for concern of palpitations, this involves an extensive number of treatment options, and is a complaint that carries with it a high risk of complications and morbidity.  The differential diagnosis includes but is not limited to cardiac arrhythmia, SVT, WW, ventricular tachycardia, atrial fibrillation, atrial flutter, anxiety, thyroid  storm, electrolyte dysfunction  Comorbidities that complicate the patient evaluation: Patient's presentation is complicated by their history of diabetes   Additional history obtained: Additional history obtained from spouse   Lab  Tests: I Ordered, and personally interpreted labs.  The pertinent results include: Labs overall reassuring  Cardiac Monitoring: The patient was maintained on a cardiac monitor.  I personally viewed and interpreted the cardiac monitor which showed an underlying rhythm of:  sinus rhythm  Test Considered: I have low suspicion for acute cardiac event, will defer further workup  Reevaluation: After the interventions noted above, I reevaluated the patient and found that they have :improved  Complexity of problems addressed: Patient's presentation is most consistent with  acute presentation with potential threat to life or bodily function  Disposition: After consideration of the diagnostic results and the patient's response to treatment,  I feel that the patent would benefit from discharge  .        Final diagnoses:  Palpitations    ED Discharge Orders     None          Midge Golas, MD 05/08/24 0210

## 2024-05-27 ENCOUNTER — Other Ambulatory Visit (HOSPITAL_BASED_OUTPATIENT_CLINIC_OR_DEPARTMENT_OTHER): Payer: Self-pay

## 2024-06-14 ENCOUNTER — Other Ambulatory Visit (HOSPITAL_BASED_OUTPATIENT_CLINIC_OR_DEPARTMENT_OTHER): Payer: Self-pay

## 2024-06-14 MED ORDER — MOUNJARO 2.5 MG/0.5ML ~~LOC~~ SOAJ
2.5000 mg | SUBCUTANEOUS | 0 refills | Status: DC
  Filled 2024-06-14: qty 2, 28d supply, fill #0

## 2024-07-11 ENCOUNTER — Other Ambulatory Visit (HOSPITAL_BASED_OUTPATIENT_CLINIC_OR_DEPARTMENT_OTHER): Payer: Self-pay

## 2024-07-11 MED ORDER — MOUNJARO 5 MG/0.5ML ~~LOC~~ SOAJ
5.0000 mg | SUBCUTANEOUS | 0 refills | Status: AC
  Filled 2024-07-11: qty 2, 28d supply, fill #0
  Filled 2024-08-08: qty 2, 28d supply, fill #1
  Filled 2024-09-02: qty 2, 28d supply, fill #2

## 2024-07-15 ENCOUNTER — Other Ambulatory Visit (HOSPITAL_BASED_OUTPATIENT_CLINIC_OR_DEPARTMENT_OTHER): Payer: Self-pay

## 2024-08-08 ENCOUNTER — Other Ambulatory Visit (HOSPITAL_BASED_OUTPATIENT_CLINIC_OR_DEPARTMENT_OTHER): Payer: Self-pay

## 2024-08-08 MED ORDER — CLONAZEPAM 0.5 MG PO TABS
0.5000 mg | ORAL_TABLET | Freq: Every day | ORAL | 0 refills | Status: AC | PRN
  Filled 2024-08-08: qty 10, 10d supply, fill #0

## 2024-09-02 ENCOUNTER — Other Ambulatory Visit: Payer: Self-pay
